# Patient Record
Sex: Male | Born: 1980 | Race: White | Hispanic: No | Marital: Single | Smoking: Current every day smoker
Health system: Southern US, Community
[De-identification: ages and names within clinical notes are randomized; demographics above are authoritative.]

## PROBLEM LIST (undated history)

## (undated) ENCOUNTER — Inpatient Hospital Stay (HOSPITAL_COMMUNITY): Payer: Self-pay | Admitting: Orthopaedic Surgery of the Spine

## (undated) DIAGNOSIS — F32A Depression, unspecified: Secondary | ICD-10-CM

## (undated) DIAGNOSIS — E079 Disorder of thyroid, unspecified: Secondary | ICD-10-CM

## (undated) DIAGNOSIS — F419 Anxiety disorder, unspecified: Secondary | ICD-10-CM

## (undated) HISTORY — DX: Anxiety disorder, unspecified: F41.9

## (undated) HISTORY — DX: Disorder of thyroid, unspecified: E07.9

## (undated) HISTORY — PX: SURGICAL HX OTHER: 99

## (undated) HISTORY — DX: Depression, unspecified: F32.A

## (undated) SURGERY — FACETECTOMY, SPINE, LUMBAR, BY ORTHOPEDICS
Anesthesia: General | Site: Spine Lumbar | Laterality: Bilateral

---
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
## 2006-11-18 ENCOUNTER — Emergency Department (HOSPITAL_COMMUNITY): Admission: EM | Admit: 2006-11-18 | Discharge: 2006-11-18 | Payer: Self-pay | Admitting: Emergency Medicine

## 2007-04-26 ENCOUNTER — Inpatient Hospital Stay (HOSPITAL_COMMUNITY): Admission: EM | Admit: 2007-04-26 | Discharge: 2007-05-02 | Payer: Self-pay | Admitting: Emergency Medicine

## 2007-05-02 ENCOUNTER — Emergency Department (HOSPITAL_COMMUNITY): Admission: EM | Admit: 2007-05-02 | Discharge: 2007-05-02 | Payer: Self-pay | Admitting: Emergency Medicine

## 2007-06-09 ENCOUNTER — Encounter: Admission: RE | Admit: 2007-06-09 | Discharge: 2007-09-07 | Payer: Self-pay | Admitting: Orthopedic Surgery

## 2008-05-07 ENCOUNTER — Emergency Department (HOSPITAL_COMMUNITY): Admission: EM | Admit: 2008-05-07 | Discharge: 2008-05-07 | Payer: Self-pay | Admitting: Emergency Medicine

## 2008-05-16 IMAGING — US US SCROTUM
1 series · 14 of 25 positions shown · non-contrast
Comparison: none

CLINICAL DATA: Swelling and pain.  
SCROTAL ULTRASOUND:
DOPPLER ULTRASOUND OF THE TESTICLES:
TECHNIQUE: Complete ultrasound examination of the testicles, epididymis, and other scrotal structures was performed.  Color and duplex Doppler ultrasound was utilized to evaluate blood flow to the testicles.
No priors for comparison.

[Series 1: unknown · 0.09mm/px · 14 of 38 slices shown]
[im 1/38]
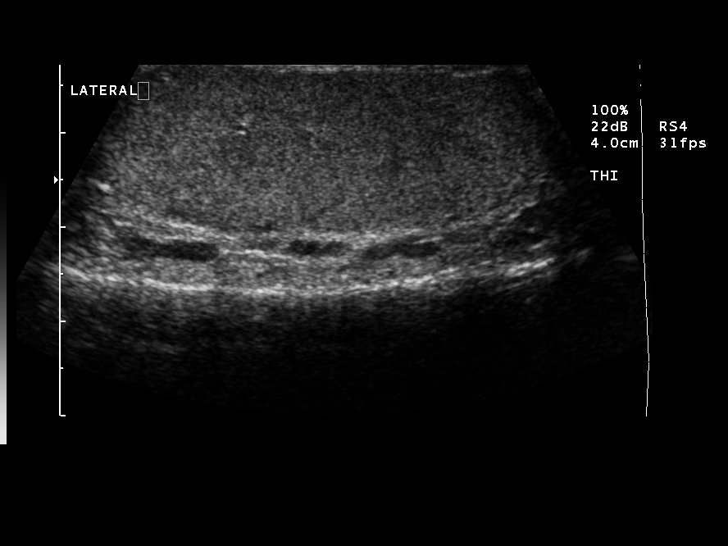
[im 4/38]
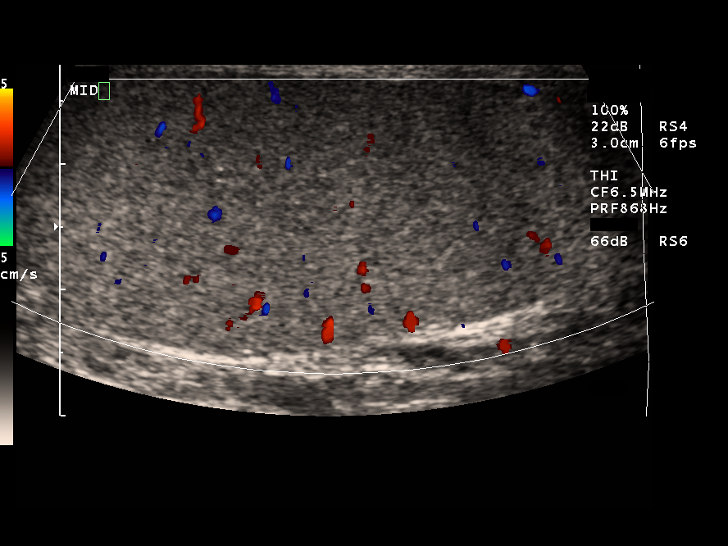
[im 7/38]
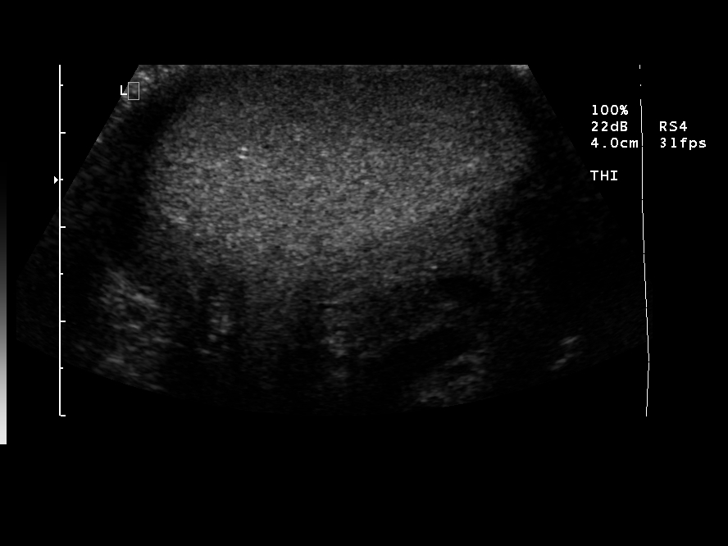
[im 10/38]
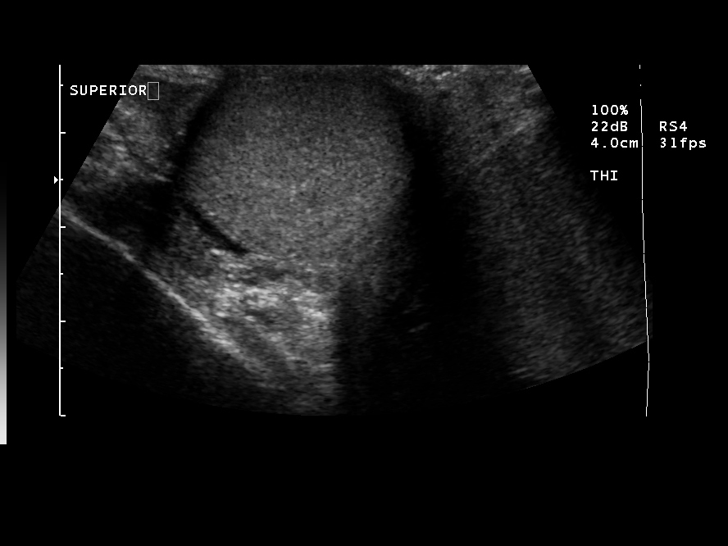
[im 13/38]
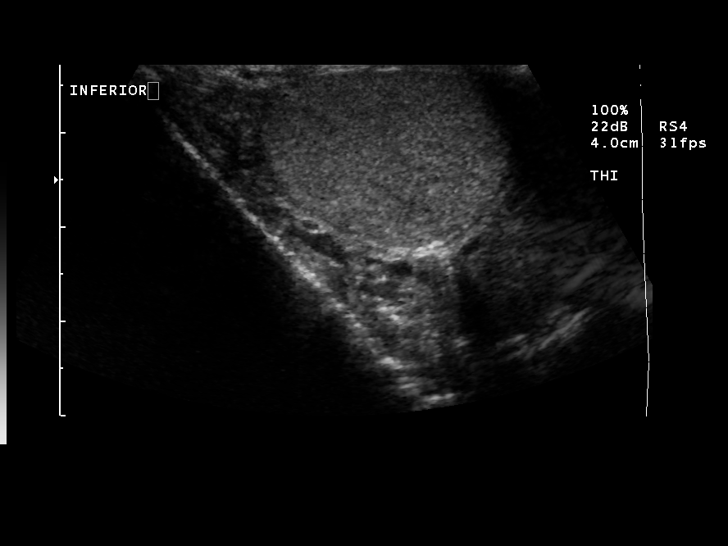
[im 14/38]
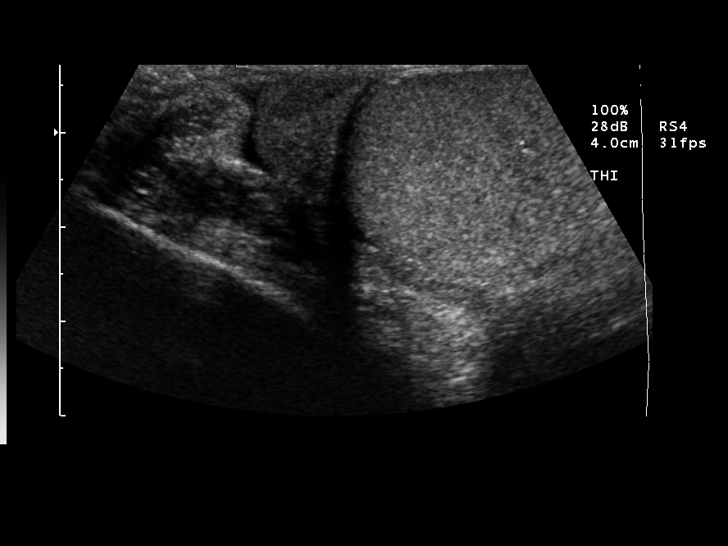
[im 17/38]
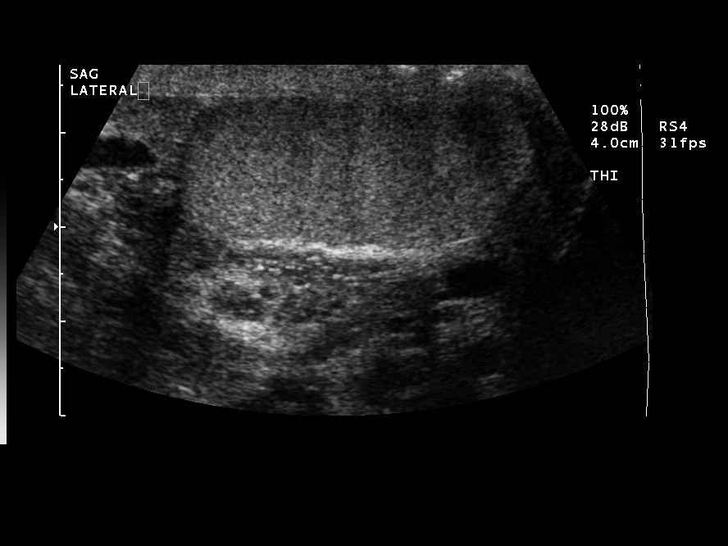
[im 21/38]
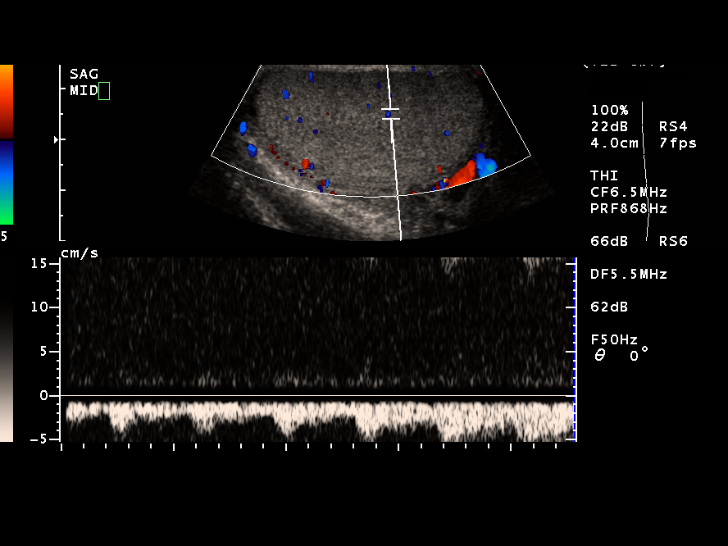
[im 24/38]
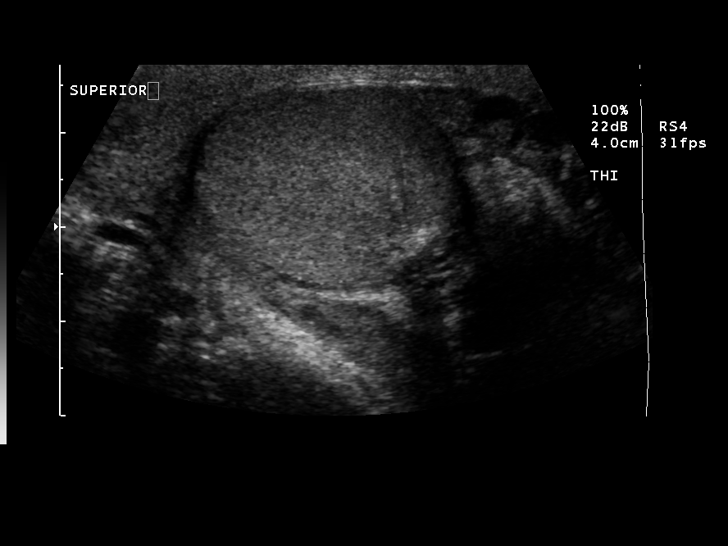
[im 25/38]
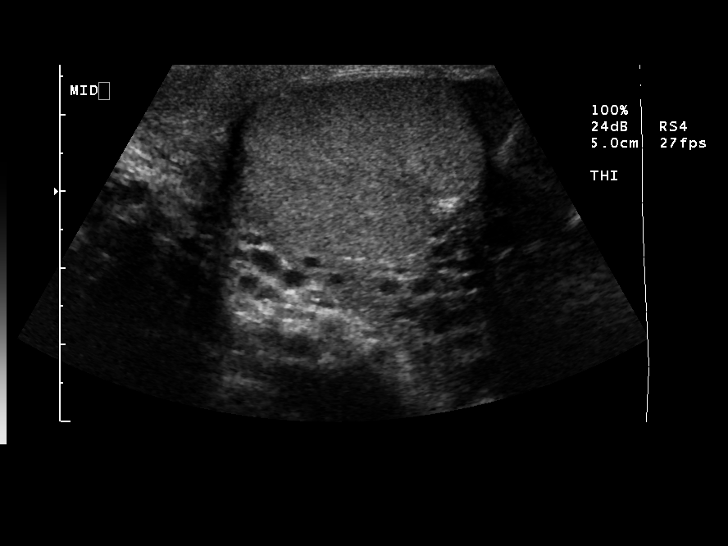
[im 28/38]
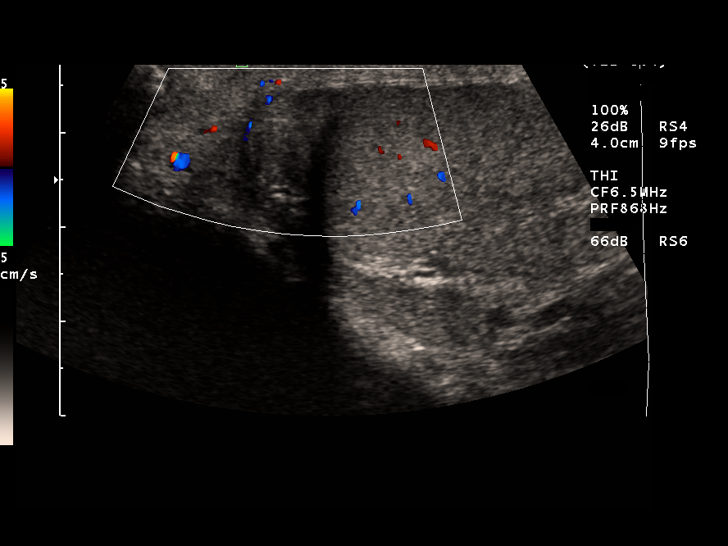
[im 31/38]
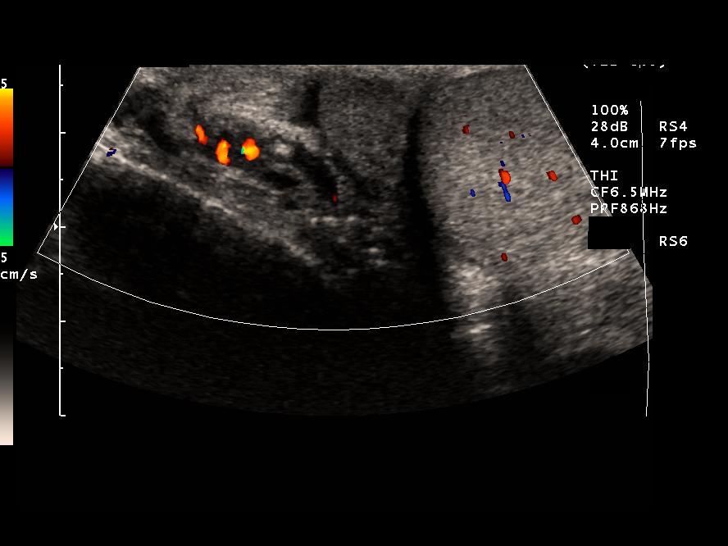
[im 34/38]
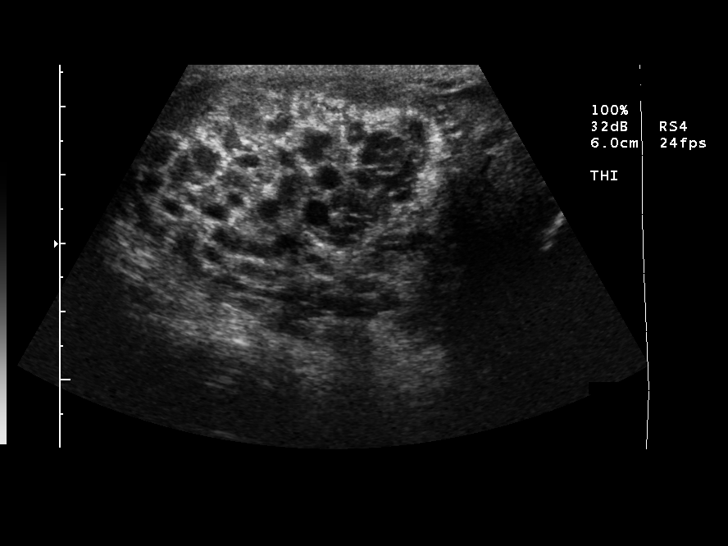
[im 38/38]
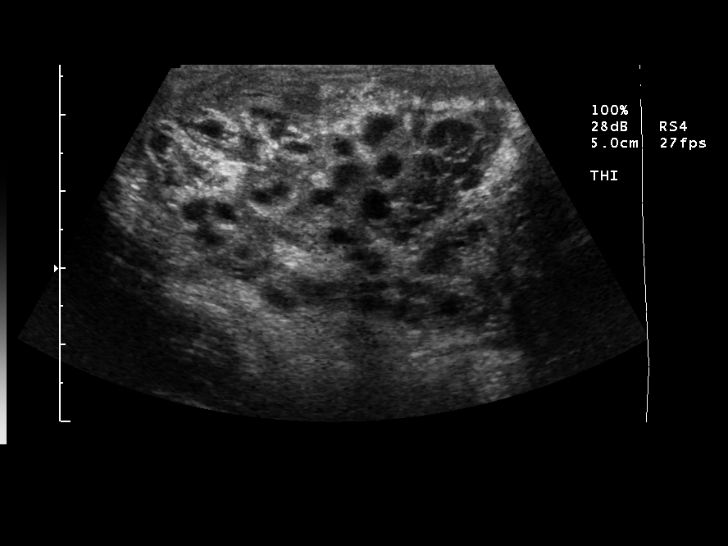

[14 of 25 positions shown; findings below may reference images not displayed]

FINDINGS: Both testes are normal in size and echotexture measuring 5.3 x 2.2 x 2.9 cm on the right and 4.8 x 2.3 x 3.2 cm on the left.  rterial and venous waveforms are seen within both testes.  The epididymides are normal in appearance.  There is a small left hydrocele.  
There is a serpiginous network of tubular lucencies in the region of the spermatic cord on the left, which is shown to have color Doppler flow and increase in size with the Valsalva maneuver, consistent with varicocele.  No evidence of varicocele on the right.
IMPRESSION: 1.  Left-sided varicocele.
2.  Small left hydrocele.
3.  Normal appearance of the testes and epididymides bilaterally.

## 2008-06-24 ENCOUNTER — Emergency Department (HOSPITAL_COMMUNITY): Admission: EM | Admit: 2008-06-24 | Discharge: 2008-06-24 | Payer: Self-pay | Admitting: Emergency Medicine

## 2008-07-14 ENCOUNTER — Emergency Department (HOSPITAL_COMMUNITY): Admission: EM | Admit: 2008-07-14 | Discharge: 2008-07-14 | Payer: Self-pay | Admitting: Emergency Medicine

## 2008-07-15 ENCOUNTER — Emergency Department (HOSPITAL_COMMUNITY): Admission: EM | Admit: 2008-07-15 | Discharge: 2008-07-15 | Payer: Self-pay | Admitting: Emergency Medicine

## 2010-02-07 ENCOUNTER — Ambulatory Visit (HOSPITAL_BASED_OUTPATIENT_CLINIC_OR_DEPARTMENT_OTHER): Admission: RE | Admit: 2010-02-07 | Discharge: 2010-02-07 | Payer: Self-pay | Admitting: Orthopedic Surgery

## 2011-01-29 NOTE — Op Note (Signed)
NAME:  Seth Porter, Seth Porter                ACCOUNT NO.:  [id number]   MEDICAL RECORD NO.:  [id number]          PATIENT TYPE:  INP   LOCATION:  5021                         FACILITY:  [facility]   PHYSICIAN:  Burnard Bunting, M.D.    DATE OF BIRTH:  11/02/1980   DATE OF PROCEDURE:  04/26/2007  DATE OF DISCHARGE:                               OPERATIVE REPORT   ADDENDUM:   PREOPERATIVE DIAGNOSIS:  Open fracture with nail bed laceration distal  phalanx second toe left foot.   POSTOPERATIVE DIAGNOSIS:  Open fracture with nail bed laceration distal  phalanx second toe left foot.   PROCEDURE:  1. Irrigation and debridement with nail bed repair of left foot.  2. Irrigation and debridement with toe nail removal.  3. Nail bed laceration repair left second toe.   SURGEON:  Burnard Bunting, M.D.   Following open reduction and internal fixation of Lisfranc fracture, the  toe nail of the second toe was dangling and was removed.  Nail bed  laceration and fracture of the distal toe was visualized.  This was  irrigated and debrided.  The nail bed laceration was then closed using  two 4-0 chromic sutures.  Bactroban and Xeroform was then placed over  the distal phalanx.      Burnard Bunting, M.D.  Electronically Signed     GSD/MEDQ  D:  04/28/2007  T:  04/28/2007  Job:  [id number]
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
## 2011-06-28 LAB — URINALYSIS, ROUTINE W REFLEX MICROSCOPIC
Bilirubin Urine: NEGATIVE
Hgb urine dipstick: NEGATIVE
Ketones, ur: NEGATIVE
Protein, ur: NEGATIVE
Urobilinogen, UA: 1

## 2011-07-01 LAB — CK TOTAL AND CKMB (NOT AT ARMC)
CK, MB: 16.9 — ABNORMAL HIGH
CK, MB: 20.3 — ABNORMAL HIGH
CK, MB: 25.6 — ABNORMAL HIGH
Relative Index: 1.2
Relative Index: 1.3
Relative Index: 1.9
Total CK: 1057 — ABNORMAL HIGH

## 2011-07-01 LAB — CBC
HCT: 41.7
Hemoglobin: 11.3 — ABNORMAL LOW
Hemoglobin: 13.4
Hemoglobin: 14.2
MCHC: 34.2
MCV: 94.6
Platelets: 343
RBC: 3.49 — ABNORMAL LOW
RBC: 4.4
RDW: 13.4
RDW: 13.8

## 2011-07-01 LAB — BASIC METABOLIC PANEL
Calcium: 8.3 — ABNORMAL LOW
Calcium: 8.4
GFR calc Af Amer: >60
GFR calc non Af Amer: >60
GFR calc non Af Amer: >60
Glucose, Bld: 65 — ABNORMAL LOW
Sodium: 138
Sodium: 138

## 2011-07-01 LAB — DIFFERENTIAL
Basophils Absolute: 0.2 — ABNORMAL HIGH
Basophils Relative: 1
Eosinophils Absolute: 0
Eosinophils Relative: 0

## 2011-07-01 LAB — POCT I-STAT CREATININE: Operator id: 192351

## 2011-07-01 LAB — I-STAT 8, (EC8 V) (CONVERTED LAB)
Acid-base deficit: 2
Chloride: 106
Glucose, Bld: 67 — ABNORMAL LOW
Potassium: 3.6
pH, Ven: 7.435 — ABNORMAL HIGH

## 2011-07-01 LAB — TROPONIN I: Troponin I: 0.03

## 2011-07-01 LAB — PROTIME-INR: INR: 1

## 2014-05-18 ENCOUNTER — Emergency Department (HOSPITAL_COMMUNITY): Payer: Self-pay

## 2014-05-18 ENCOUNTER — Encounter (HOSPITAL_COMMUNITY): Payer: Self-pay | Admitting: Emergency Medicine

## 2014-05-18 ENCOUNTER — Emergency Department (HOSPITAL_COMMUNITY)
Admission: EM | Admit: 2014-05-18 | Discharge: 2014-05-18 | Disposition: A | Discharge status: Home / Self Care | Payer: Self-pay | Attending: Emergency Medicine | Admitting: Emergency Medicine

## 2014-05-18 DIAGNOSIS — F17200 Nicotine dependence, unspecified, uncomplicated: Secondary | ICD-10-CM | POA: Insufficient documentation

## 2014-05-18 DIAGNOSIS — Z720 Tobacco use: Secondary | ICD-10-CM

## 2014-05-18 DIAGNOSIS — J3489 Other specified disorders of nose and nasal sinuses: Secondary | ICD-10-CM | POA: Insufficient documentation

## 2014-05-18 DIAGNOSIS — J209 Acute bronchitis, unspecified: Secondary | ICD-10-CM | POA: Insufficient documentation

## 2014-05-18 DIAGNOSIS — Z79899 Other long term (current) drug therapy: Secondary | ICD-10-CM | POA: Insufficient documentation

## 2014-05-18 DIAGNOSIS — J40 Bronchitis, not specified as acute or chronic: Secondary | ICD-10-CM

## 2014-05-18 DIAGNOSIS — L600 Ingrowing nail: Secondary | ICD-10-CM | POA: Insufficient documentation

## 2014-05-18 DIAGNOSIS — R05 Cough: Secondary | ICD-10-CM | POA: Insufficient documentation

## 2014-05-18 DIAGNOSIS — R059 Cough, unspecified: Secondary | ICD-10-CM | POA: Insufficient documentation

## 2014-05-18 MED ORDER — ALBUTEROL SULFATE HFA 108 (90 BASE) MCG/ACT IN AERS: 1.0000 | INHALATION_SPRAY | Freq: Four times a day (QID) | RESPIRATORY_TRACT | Status: AC | PRN

## 2014-05-18 MED ORDER — GUAIFENESIN 100 MG/5ML PO LIQD: 100.0000 mg | ORAL | Status: AC | PRN

## 2014-05-18 MED ORDER — AZITHROMYCIN 250 MG PO TABS: ORAL_TABLET | ORAL | Status: AC

## 2014-05-18 MED ORDER — LIDOCAINE HCL (PF) 2 % IJ SOLN
10.0000 mL | Freq: Once | INTRAMUSCULAR | Status: AC
  Administered 2014-05-18: 10 mL
  Filled 2014-05-18: qty 10

## 2014-05-18 NOTE — ED Provider Notes (Signed)
CSN: [id number]     Arrival date & time 05/18/14  1018 History   First MD Initiated Contact with Patient 05/18/14 1030     Chief Complaint  Patient presents with  . Cough  . URI  . Foot Pain   Patient is a 33 y.o. male presenting with cough, URI, and lower extremity pain.  Cough URI Presenting symptoms: cough   Foot Pain Associated symptoms include coughing.    Patient is a 33 y.o. Male who presents to the ED with cough x 3 weeks and ingrown toe nail for "some time."  Patient states that his cough was initially productive with yellow sputum, but has now become dry and hacking in nature.  Patient also complains of some congestion, wheezing, and rhinorrhea.  Patient thinks that he has had some fevers, but has also not taken his temperature at home.  Patient is a current every day smoker and smokes 1 ppd.  He has tried tylenol and ibuprofen at home with little relief.  Patient also complains of an ingrown toenail for some time.  He states that the toe is tender and painful and has been draining yellow purulent discharge.  Patient states that he has tried soaking his foot and cutting his toenail.  He denies chills, chest pain, abdominal pain, diarrhea, constipation, melena, hematochezia.  All other ROS are negative at this time.    History reviewed. No pertinent past medical history. History reviewed. No pertinent past surgical history. History reviewed. No pertinent family history. History  Substance Use Topics  . Smoking status: Current Every Day Smoker    Types: Cigarettes  . Smokeless tobacco: Not on file  . Alcohol Use: No    Review of Systems  Respiratory: Positive for cough.     See HPI  Allergies  Shellfish allergy  Home Medications   Prior to Admission medications   Medication Sig Start Date End Date Taking? Authorizing Provider  ketoconazole (NIZORAL) 2 % cream Apply 1 application topically daily as needed for irritation.   Yes Historical Provider, MD  albuterol  (PROVENTIL HFA;VENTOLIN HFA) 108 (90 BASE) MCG/ACT inhaler Inhale 1-2 puffs into the lungs every 6 (six) hours as needed for wheezing or shortness of breath. 05/18/14   Josslyn Ciolek A Forcucci, PA-C  azithromycin (ZITHROMAX) 250 MG tablet Take 2 pills on day 1 Take 1 pill for four days 05/18/14   Pamelyn Bancroft A Forcucci, PA-C  guaiFENesin (ROBITUSSIN) 100 MG/5ML liquid Take 5-10 mLs (100-200 mg total) by mouth every 4 (four) hours as needed for cough. 05/18/14   Fern Canova A Forcucci, PA-C   BP 138/76  Pulse 110  Temp(Src) 98.5 F (36.9 C) (Oral)  Resp 18  SpO2 98% Physical Exam  Nursing note and vitals reviewed. Constitutional: He is oriented to person, place, and time. He appears well-developed and well-nourished. No distress.  HENT:  Head: Normocephalic and atraumatic.  Mouth/Throat: Oropharynx is clear and moist. No oropharyngeal exudate.  Eyes: Conjunctivae and EOM are normal. Pupils are equal, round, and reactive to light. No scleral icterus.  Neck: Normal range of motion. Neck supple. No JVD present. No thyromegaly present.  Cardiovascular: Normal rate, regular rhythm, normal heart sounds and intact distal pulses.  Exam reveals no gallop and no friction rub.   No murmur heard. Pulmonary/Chest: Effort normal and breath sounds normal. No respiratory distress. He has no wheezes. He has no rales. He exhibits no tenderness.  Abdominal: Soft. Bowel sounds are normal. He exhibits no distension and no mass. There is  no tenderness. There is no rebound and no guarding.  Musculoskeletal: Normal range of motion.  Lymphadenopathy:    He has no cervical adenopathy.  Neurological: He is alert and oriented to person, place, and time. No cranial nerve deficit. Coordination normal.  Skin: Skin is warm and dry. He is not diaphoretic.  Right great toe with ingrown toenail.  No active drainage.  Tenderness to palpation.  There is no surrounding erythema.    Psychiatric: He has a normal mood and affect. His behavior  is normal. Judgment and thought content normal.    ED Course  Excise ingrown toenail Date/Time: 05/18/2014 1:55 PM Performed by: Terri Piedra A Authorized by: Terri Piedra A Consent: Verbal consent obtained. Risks and benefits: risks, benefits and alternatives were discussed Consent given by: patient Patient understanding: patient states understanding of the procedure being performed Patient consent: the patient's understanding of the procedure matches consent given Procedure consent: procedure consent matches procedure scheduled Relevant documents: relevant documents present and verified Test results: test results available and properly labeled Site marked: the operative site was marked Imaging studies: imaging studies available Patient identity confirmed: verbally with patient Time out: Immediately prior to procedure a "time out" was called to verify the correct patient, procedure, equipment, support staff and site/side marked as required. Preparation: Patient was prepped and draped in the usual sterile fashion. Local anesthesia used: yes Anesthesia: digital block Local anesthetic: lidocaine 2% without epinephrine Anesthetic total: 4 ml Patient sedated: no Patient tolerance: Patient tolerated the procedure well with no immediate complications.   (including critical care time) Labs Review Labs Reviewed - No data to display  Imaging Review Dg Chest 2 View  05/18/2014   CLINICAL DATA:  Cough, upper respiratory infection.  EXAM: CHEST  2 VIEW  COMPARISON:  04/27/2007.  FINDINGS: Trachea is midline. Heart size normal. Lungs are hyperinflated but clear. Left apical pleural thickening. No pleural fluid.  IMPRESSION: No acute findings.   Electronically Signed   By: Leanna Battles M.D.   On: 05/18/2014 12:16     EKG Interpretation None      MDM   Final diagnoses:  Ingrown right greater toenail  Bronchitis  Tobacco abuse   Patient is a 32 y.o. Male who presents to  the ED with right great toe pain and cough.  Physical exam reveals no wheezes or rales.  CXR is clear.  I have removed the portion of the toe nail.  There are no signs of infection of the toe at this time.  Patient is stable for discharge.  Will send the patient home with albuterol, azithromycin given length of cough, and robitussin.  Patient to follow-up with a doctor of his choosing or community health and wellness.  Patient was discussed with Dr. Micheline Maze who agrees with the above plan.  Patient told to return for PE and PNA symptoms.  He states understanding and agreement at this time.      Eben Burow, PA-C 05/18/14 1355

## 2014-05-18 NOTE — Discharge Instructions (Signed)
Acute Bronchitis Bronchitis is inflammation of the airways that extend from the windpipe into the lungs (bronchi). The inflammation often causes mucus to develop. This leads to a cough, which is the most common symptom of bronchitis.  In acute bronchitis, the condition usually develops suddenly and goes away over time, usually in a couple weeks. Smoking, allergies, and asthma can make bronchitis worse. Repeated episodes of bronchitis may cause further lung problems.  CAUSES Acute bronchitis is most often caused by the same virus that causes a cold. The virus can spread from person to person (contagious) through coughing, sneezing, and touching contaminated objects. SIGNS AND SYMPTOMS   Cough.   Fever.   Coughing up mucus.   Body aches.   Chest congestion.   Chills.   Shortness of breath.   Sore throat.  DIAGNOSIS  Acute bronchitis is usually diagnosed through a physical exam. Your health care provider will also ask you questions about your medical history. Tests, such as chest X-rays, are sometimes done to rule out other conditions.  TREATMENT  Acute bronchitis usually goes away in a couple weeks. Oftentimes, no medical treatment is necessary. Medicines are sometimes given for relief of fever or cough. Antibiotic medicines are usually not needed but may be prescribed in certain situations. In some cases, an inhaler may be recommended to help reduce shortness of breath and control the cough. A cool mist vaporizer may also be used to help thin bronchial secretions and make it easier to clear the chest.  HOME CARE INSTRUCTIONS  Get plenty of rest.   Drink enough fluids to keep your urine clear or pale yellow (unless you have a medical condition that requires fluid restriction). Increasing fluids may help thin your respiratory secretions (sputum) and reduce chest congestion, and it will prevent dehydration.   Take medicines only as directed by your health care provider.  If  you were prescribed an antibiotic medicine, finish it all even if you start to feel better.  Avoid smoking and secondhand smoke. Exposure to cigarette smoke or irritating chemicals will make bronchitis worse. If you are a smoker, consider using nicotine gum or skin patches to help control withdrawal symptoms. Quitting smoking will help your lungs heal faster.   Reduce the chances of another bout of acute bronchitis by washing your hands frequently, avoiding people with cold symptoms, and trying not to touch your hands to your mouth, nose, or eyes.   Keep all follow-up visits as directed by your health care provider.  SEEK MEDICAL CARE IF: Your symptoms do not improve after 1 week of treatment.  SEEK IMMEDIATE MEDICAL CARE IF:  You develop an increased fever or chills.   You have chest pain.   You have severe shortness of breath.  You have bloody sputum.   You develop dehydration.  You faint or repeatedly feel like you are going to pass out.  You develop repeated vomiting.  You develop a severe headache. MAKE SURE YOU:   Understand these instructions.  Will watch your condition.  Will get help right away if you are not doing well or get worse. Document Released: 10/10/2004 Document Revised: 01/17/2014 Document Reviewed: 02/23/2013 [facility] Patient Information 2015 [place], [place]. This information is not intended to replace advice given to you by your health care provider. Make sure you discuss any questions you have with your health care provider.  Ingrown Toenail An ingrown toenail occurs when the sharp edge of your toenail grows into the skin. Causes of ingrown toenails include toenails  clipped too far back or poorly fitting shoes. Activities involving sudden stops (basketball, tennis) causing "toe jamming" may lead to an ingrown nail. HOME CARE INSTRUCTIONS   Soak the whole foot in warm soapy water for 20 minutes, 3 times per day.  You may lift the edge of the nail  away from the sore skin by wedging a small piece of cotton under the corner of the nail. Be careful not to dig (traumatize) and cause more injury to the area.  Wear shoes that fit well. While the ingrown nail is causing problems, sandals may be beneficial.  Trim your toenails regularly and carefully. Cut your toenails straight across, not in a curve. This will prevent injury to the skin at the corners of the toenail.  Keep your feet clean and dry.  Crutches may be helpful early in treatment if walking is painful.  Antibiotics, if prescribed, should be taken as directed.  Return for a wound check in 2 days or as directed.  Only take over-the-counter or prescription medicines for pain, discomfort, or fever as directed by your caregiver. SEEK IMMEDIATE MEDICAL CARE IF:   You have a fever.  You have increasing pain, redness, swelling, or heat at the wound site.  Your toe is not better in 7 days. If conservative treatment is not successful, surgical removal of a portion or all of the nail may be necessary. MAKE SURE YOU:   Understand these instructions.  Will watch your condition.  Will get help right away if you are not doing well or get worse. Document Released: 08/30/2000 Document Revised: 11/25/2011 Document Reviewed: 08/24/2008 [facility] Patient Information 2015 [place], [place]. This information is not intended to replace advice given to you by your health care provider. Make sure you discuss any questions you have with your health care provider.

## 2014-05-18 NOTE — ED Notes (Signed)
Pt offered blanket, refused

## 2014-05-18 NOTE — ED Notes (Signed)
Pt reports not feeling well for weeks. Started as URI/cold symptoms now having productive cough with green/yellow sputum and feeling lightheaded. Also reports excessive walking and now has sores/pain to his feet and is worried its infected. Pt ambulatory at triage, airway intact.

## 2014-05-18 NOTE — ED Provider Notes (Signed)
Medical screening examination/treatment/procedure(s) were performed by non-physician practitioner and as supervising physician I was immediately available for consultation/collaboration.  Denetra Formoso, MD 05/18/14 1716 

## 2014-05-18 NOTE — ED Notes (Signed)
Pt given [place] sandwich and water, waiting for dispo papers.

## 2014-05-27 NOTE — Discharge Planning (Signed)
[facility] [employer] with patient regarding primary care resources and establishing care with a provider. Patient sts he is being followed with care by the [facility]. Resource guide and my contact information provided for any future questions or concerns.

## 2019-10-13 ENCOUNTER — Other Ambulatory Visit: Payer: Self-pay

## 2019-11-05 ENCOUNTER — Telehealth (HOSPITAL_BASED_OUTPATIENT_CLINIC_OR_DEPARTMENT_OTHER): Payer: Self-pay | Admitting: Orthopaedic Surgery of the Spine

## 2019-11-05 NOTE — Telephone Encounter (Signed)
Called patient to advise we do take L&I we just need the auth/referral from them to proceed. Confirmed [phone] to fax that to. Collected referral information so we can proceed once Cody Hart is received. Patient had PT at [facility] in [place]. Requested records today.

## 2019-11-05 NOTE — Telephone Encounter (Signed)
RETURN CALL: Voicemail - Detailed Message      SUBJECT:  Appointment Request     REASON FOR VISIT: [facility] for L&I Claim  PREFERRED DATE/TIME: next availab  ADDITIONAL INFORMATION: none

## 2019-11-18 ENCOUNTER — Ambulatory Visit (HOSPITAL_BASED_OUTPATIENT_CLINIC_OR_DEPARTMENT_OTHER)
Payer: No Typology Code available for payment source | Attending: Orthopaedic Surgery of the Spine | Admitting: Student in an Organized Health Care Education/Training Program

## 2019-11-18 ENCOUNTER — Encounter (HOSPITAL_BASED_OUTPATIENT_CLINIC_OR_DEPARTMENT_OTHER): Payer: Self-pay | Admitting: Orthopaedic Surgery of the Spine

## 2019-11-18 VITALS — BP 124/88 | HR 78 | Temp 99.4°F | Resp 16 | Ht 75.0 in | Wt 180.0 lb

## 2019-11-18 DIAGNOSIS — M5020 Other cervical disc displacement, unspecified cervical region: Secondary | ICD-10-CM | POA: Insufficient documentation

## 2019-11-18 NOTE — Patient Instructions (Signed)
It was a pleasure to see  you today.     We discussed surgery on your spine to include C6-7 discectomy and fusion vs C6 corpectomy and C5-7 anterior fusion.     About 1-2 weeks before surgery, the spine team will see you again for a "PRE OP VISIT". This will also include a visit with our clinic nurses, "RN TEACHING" as well as a visit with the anesthesia team nurses "PRE ANESTHESIA VISIT".  If you are having a very large surgery or if you have certain medical problems, you will also need to see our [place] team who work with [place] to make sure you are safe to proceed with surgery or help to optimize your medical issues for upcoming surgery.     All of these visits will be arranged by our PATIENT CARE COORDINATORS. You may have met them today in clinic or they may be planning to give you a call later this week.     Eulogio Ditch works with Dr. Talbert Cage and Dr Tawnya Crook and can be reached at [place] works with Dr. Quintella Reichert and can be reached at [phone].     Please call them directly if you have any questions about your surgery. If they don't know they answer they will make sure your questions get answered.     Also before surgery, we may have ordered new imaging studies or other tests. Please get these done as soon as possible so this does not delay your surgery.     CT scan    Please let us know if you have any other questions before your surgery.     Clementeen Hoof, Coordinator  [facility]  [phone] Fusion -- taken from: [email or web address]    Spinal fusion is a surgical procedure used to correct problems with the small bones in the spine (vertebrae). It is essentially a "welding" process. The basic idea is to fuse together two or more vertebrae so that they heal into a single, solid bone. This is done to eliminate painful motion or to restore stability to the spine.  Spine surgery is usually recommended only when your doctor can  pinpoint the source of your pain. To do this, your doctor may use imaging tests, such as x-rays, computerized tomography (CT) scans, and magnetic resonance imaging (MRI) scans.  Spinal fusion may help relieve symptoms of many back problems, including:  Degenerative disk disease   Spondylolisthesis   Spinal stenosis   Scoliosis   Fractured vertebra   Infection   Herniated disk   Tumor    Understanding how your spine works will help you better understand spinal fusion. Learn more about your spine: Spine Basics.  Spinal fusion can be performed in any region of the spine.      Description  Spinal fusion eliminates motion between vertebrae. It also prevents the stretching of nerves and surrounding ligaments and muscles. It is an option when motion is the source of pain, such as movement that occurs in a part of the spine that is arthritic or unstable due to injury, disease, or the normal aging process. The theory is if the painful vertebrae do not move, they should not hurt.  If you have leg pain or arm pain in addition to back pain, your surgeon may also perform a decompression (laminectomy). This procedure involves removing bone and diseased tissues that are putting pressure on spinal nerves.  Fusion will take away some spinal flexibility, but most spinal  fusions involve only small segments of the spine and do not limit motion very much. The majority of patients will not notice a decrease in range of motion. Your surgeon will talk with you about whether your specific procedure may impact flexibility or range of motion in your spine.  To help you understand the main terms and abbreviations regarding spinal fusion, a glossary has been developed: Spinal Fusion Glossary    Procedure  Lumbar and cervical spinal fusion have been performed for decades. There are several different techniques that may be used to fuse the spine. There are also different "approaches" your surgeon can take to reach your spine.  Your surgeon may  approach your spine from the front. This is called an anterior approach and requires an incision in the lower abdomen for a lumbar fusion or in the front of the neck for a cervical fusion. (Related Article: Anterior Lumbar Interbody Fusion)  A posterior approach is done from the back. (Related Articles: Posterolateral Lumbar Fusion and Posterior Lumbar Interbody Fusion and Transforaminal Lumbar Interbody Fusion)  In a posterior approach to lumbar surgery, an incision is made down the middle of the lower back, over the vertebrae to be fused.  In a lateral approach, your surgeon approaches your spine from the side. (Related Article: Lateral Lumbar Interbody Fusion)  Minimally invasive techniques have also been developed. These allow fusions to be performed with smaller incisions.  The right procedure for you will depend on the nature and location of your disease.    Bone Grafting  All spinal fusions use some type of bone material, called a bone graft, to help promote the fusion. Generally, small pieces of bone are placed into the space between the vertebrae to be fused.  A bone graft is primarily used to stimulate bone healing. It increases bone production and helps the vertebrae heal together into a solid bone. Sometimes larger, solid pieces are used to provide immediate structural support to the vertebrae.  In the past, a bone graft harvested from the patient's pelvis was the only option for increasing the material needed for fusing the vertebrae. This type of graft is called an autograft. Harvesting a bone graft requires an additional incision during the operation. It lengthens surgery and can cause increased pain after the operation.  Most autografts are harvested from an area of the pelvis called the "iliac crest."  If you are having a decompression procedure, the surgeon may harvest your bone from the site of the decompression and use it as the graft. This type of graft is called a local autograft. The bone is  essentially recycled; it is moved from where it is compressing your nerves to the area the surgeon wants to fuse.   One alternative to harvesting a bone graft is an allograft, which is cadaver bone. An allograft is typically acquired through a bone bank.    Today, several artificial bone graft materials have also been developed:  Demineralized bone matrices (DBMs). Calcium is removed from cadaver bone to create DBMs. Without the mineral, the bone can be changed into a putty or gel-like consistency. DBMs are usually combined with other grafts, and may contain proteins that help in bone healing.   Bone morphogenetic proteins (BMPs). These very powerful synthetic bone-forming proteins promote a solid fusion. They are approved by the U[place]. Food and Drug Administration for use in the spine in certain situations. Autografts may not be needed when BMPs are used.   Synthetic bone. Synthetic bone grafts are made  from calcium/phosphate materials and are often called "ceramics." They are similar in shape and consistency to autograft bone.  Your surgeon will discuss with you the type of bone graft material that will work best for your condition and procedure.      Immobilization  After bone grafting, the vertebrae need to be held together to help the fusion progress. Your surgeon may suggest that you wear a brace.  In many cases, surgeons will use plates, screws, and rods to help hold the spine still. This is called internal fixation, and may increase the rate of successful healing. With the added stability from internal fixation, most patients are able to move earlier after surgery.      Complications  As with any surgery, there are risks associated with spinal fusion. Your doctor will discuss each of the risks with you before your procedure and will take specific measures to help avoid potential complications. Potential risks and complication of spinal fusion include:  Infection. Antibiotics are regularly given to the patient  before, during, and often after surgery to lessen the risk of infections.   Bleeding. A certain amount of bleeding is expected, but this is not typically significant. It is not usually necessary to donate blood before spinal fusion.   Pain at graft site. A small percentage of patients will experience persistent pain at the bone graft site.   Recurring symptoms. Some patients may experience a recurrence of their original symptoms. There are various causes for this. If your original symptoms recur, inform your doctor so that he or she can determine what is causing your symptoms.   Pseudarthrosis. This is a condition in which there is not enough bone formation. Patients who smoke are more likely to develop a pseudarthrosis. Other causes include diabetes and older age. Moving too soon-before the bone is able to start fusing-may also result in a pseudarthrosis. If this occurs, a second surgery may be needed in order to obtain a solid fusion.   Nerve damage. It is possible that nerves or blood vessels may be injured during these operations. These complications are very rare.   Blood clots. Another uncommon complication is the formation of blood clots in the legs. These pose significant danger if they break off and travel to the lungs.    Warning Signs  It is important that you carefully follow any instructions from your doctor relating to the warning signs of blood clots and infection. These complications are most likely to occur during the first few weeks after surgery.  --Blood clots. Warning signs of a possible blood clot include:  Swelling in the calf, ankle or foot   Tenderness or redness, which may extend above or below the knee   Pain in the calf  Occasionally, a blood clot will travel through the bloodstream and may settle in the lungs. If this happens, you may experience sudden chest pain and shortness of breath or coughing. If you experience any of these symptoms, you should notify your doctor immediately. If  you cannot reach your doctor, have someone take you to the hospital emergency room or call 911.    --Infection. Infection following spine surgery occurs very rarely. Warning signs of infection include:  Redness, tenderness, and swelling around the wound edges   Drainage from the wound   Pain or tenderness   Shaking chills   Elevated temperature, usually above 101F if taken with an oral thermometer  If any of these symptoms occur, you should contact your doctor or go  to the nearest emergency room immediately.  Recovery  Pain Management  After surgery, you will feel some pain. This is a natural part of the healing process. Your doctor and nurses will work to reduce your pain, which can help you recover from surgery faster.  Medications are often prescribed for short-term pain relief after surgery. Many types of medicines are available to help manage pain, including opioids, nonsteroidal anti-inflammatory drugs (NSAIDs), and local anesthetics. Your doctor may use a combination of these medications to improve pain relief, as well as minimize the need for opioids.  Be aware that although opioids help relieve pain after surgery, they are a narcotic and can be addictive. Opioid dependency and overdose has become a critical public health issue in the [place] It is important to use opioids only as directed by your doctor. As soon as your pain begins to improve, stop taking opioids. Talk to your doctor if your pain has not begun to improve within a few days of your surgery.  Rehabilitation  The fusion process takes time. It may be several months before the bone is solid, although your comfort level will often improve much faster. During this healing time, the fused spine must be kept in proper alignment. You will be taught how to move properly, reposition, sit, stand, and walk.  Your symptoms will gradually improve, as will your activity level. Right after your operation, your doctor may recommend only light activity, like  walking. As you regain strength, you will be able to slowly increase your activity level. Physical therapy is typically started from 6 weeks to 3 months after surgery. Your surgeon will talk with you about whether physical therapy is needed in your situation.  Maintaining a healthy lifestyle and following your doctor's instructions will greatly increase your chances for a successful outcome.    If you have additional questions after your visit, please contact the clinic.     Sincerely,   Forde Radon, [facility]  [place]  [phone]    POST-OPERATIVE SPINE SURGERY INSTRUCTIONS      CONTACT INFORMATION:   * Appointment Scheduling: [phone]  * Questions During Clinic Hours : [phone] PRESS 2  * Questions after hours: [phone]    Patient Care Coordinators:  Eulogio Ditch (Drs Gary Fleet) -- [phone]  Graylin Shiver (Dr Quintella Reichert) --[phone]  Stark Klein (Dr Leanord Asal) -- [phone]    IMPORTANT PATIENT SPECIFIC MODIFICATIONS:   * Weight bearing status: weight bearing as tolerated with walker or cane    PAIN MANAGEMENT:   **please note that laws are changing and more and more insurance companies are only covering a limited supply of narcotics. This may mean you have to pay OUT OF POCKET for pain medications.**     PAIN CONTROL      You should expect to wean off the oxycodone usually within the first week after surgery.   * Some discomfort is normal and expected following any operation.   * You will be given a prescription for pain medications along with instructions for use. Take them as directed on the label and with food. Use caution not to lose them, drop them in the toilet, etc.   * Please let us know a few days in advance before you run out of pain medication. You may call the Orthopedic Pharmacist at [phone]. We can only prescribe pain medications during the first 6 weeks (42 days) after surgery. If you need medications after that time, you will need to see your PCP.   *  DO NOT operate heavy machinery, drive, or drink alcohol while on narcotic medications.   * Pain medications can cause constipation, nausea and vomiting, sometimes itching and hives. Drink plenty of fluids and eat a diet higher in fiber after surgery. For constipation consider over the counter additions i.e. Metamucil, Senokot, docusate or another over the counter stool softener. Take your pain medications with small amounts of food to decrease nausea/vomiting. Medication can be prescribed for nausea as needed, on a case-by-case basis.   * If your pain is not adequately controlled by the prescription medication, contact your surgeon's office or the Orthopedic pharmacy at the numbers listed on this sheet.   * Note: Post-op pain is typically increased and more difficult to manage if you were taking opiate pain medication prior to surgery.       ITEMS TO PICK UP AT PHARMACY PRIOR TO SURGERY   -- senna and/or docusate -- these are two different stool softeners to prevent constipation caused by pain medications.   -- waterproof bandages (such as Tegaderm) to cover incisions while sutures in place for showering. Many brands and sizes exist.   --a walker or cane may be needed after thoracic or lumbar surgery to help you get around safely.  -- it is usually helpful to have some sterile gauze (4x4), tape, sterile bandages on hand in case you need them afterwards.     DRESSING CHANGES/WOUND CARE:   * Follow the instructions from the nurse about keeping your incision dry. Depending on the procedure, you may need to keep it dry for 5-7 days after surgery.   * Your incision sites will be protected with a bandage.   * Keep the incision covered with appropriately sized bandages. Once the drainage has stopped, you may choose to leave the incision uncovered. If the stitches are irritating or pulling on your clothing, it is okay to place a clean dry dressing over it.   * Sutures/stitches are typically removed about 3 weeks after  surgery at your first post-operative clinic visit.   *Do not peel off any skin glue or scabs from your incision.  Allow it to fall off naturally.   * Mild - moderate bleeding may occur at the incision sites. This should decrease quickly over time.   * You should anticipate changing the dressing daily or more frequently if it becomes wet or soiled.  * DO NOT touch incisions, DO NOT apply ointment/cream; DO NOT let dirt/dust get in/on incisions.   * At each dressing change evaluate the incision(s) for infection, report early signs of infection IMMEDIATELY.   * Odorless drainage that is clear with slightly red/yellow tinge is normal as long as it decreases over time. Drainage that is solidly white/yellow/green/brown with an unpleasant odor increasing over time is abnormal.   * Redness around the incision is normal but should slowly decrease over time. Redness that is expanding or becoming brighter or fiery hot to touch over time is abnormal.   * Bruising is normal after surgery and may spread over a larger surface area due to gravity.   * A slight fever initially post-op is not uncommon however should be closely watched and spike in temperature should be reported immediately especially if associated with chills, night sweats, increasing pain. Contact us for fevers over 101.   * Avoid baths, swimming pools and hot tubs until all incisions are completely healed. AT LEAST 3-4 weeks post-op.     SHOWERING:   *follow the when to shower  instructions as provided by the nurse. It depends on what kind of surgery you had.  * After sutures are removed, simply let water roll over your incision sites only. No complete submersion or soaking in bathtub, pools, hot tubs, lakes, etc. until incisions are completely healed.   * Use a stool/chair/seat in the shower if you feel unstable or unsafe.     ICE/SWELLING:   * Swelling is normal after surgery.   * Ice is very therapeutic and helps reduce pain and swelling. Ice via ice packs,  freezer wraps, or an ice machine.   * Never place ice packs directly on your skin.   * It is best to ice your surgical site every 2-3 hours for 20-30 minutes for 14 days after surgery. Plan to ice after physical therapy and home exercises.   * You may experience increased swelling after longer periods of being upright or after increased activity.   *when resting keep your legs elevated to help reduce swelling in the legs    DEEP VEIN THROMBOSIS (DVT):   * When a blood clot has formed in a deep vein of the leg. This may result in redness, swelling, warmth and/or pain or cramps of the lower leg or calf region. You may also experience shortness of breath, difficulty breathing, chest pain or tightness. CONTACT THE CLINIC IMMEDIATELY IF YOU ARE CONCERNED ABOUT THESE SYMPTOMS.  * For several days (at least until you are walking about most of the day), rotate your ankle, bend your toes and move your foot back and forth and from side to side, frequently (at least a few minutes every hour while you are awake) in order to keep blood circulation flowing so that blood clotting will be less likely to occur. The better circulation, the less chance of blood clotting and a DVT.       ACTIVITY/RESTRICTIONS:     Follow the guidelines below to do your daily activities. At first you may need to take a lot of breaks. Be sure to include rest times in your plan for each day. You may only be able to walk around for short periods at a time with longer rests in between. Right after surgery start with 5 minutes of activity every 2-3 hours. As you improve and have a better sense of your ability, strength and stamina, you can increase the length of activity sessions.     Make sure you get as much sleep as possible. Sometimes it is hard to sleep for long periods of time due to pain, so you may find yourself taking naps during the day. This is okay, but realize that sometimes that will cause you not to be able to sleep at night.     Try to get up  and get dressed every day. It will help you feel better and more normal.     Slowly resume the hobbies and other life activities you enjoy as long as you are not restricted from those activities.     PROTECT YOUR SPINE    Until your healthcare team tells you otherwise, remember to follow the BLT's. That's   BENDING: Limit the amount and frequency of bending of your back  LIFTING: do not lift more than 10 pounds. (approximately a gallon of milk) You will be instructed at your post op visits when you are allowed to increase this.   TWISTING: Limit the amount of twisting of your back.     Avoid strenuous pushing pulling and lifting.  Ask a friend or family member to help you with grocery shopping, other household chores, picking up children and pets. Careful if you have a dog to walk that it is not pulling you around or making you fall.       SLEEPING:  Try to use a mattress with good support. You may sleep in any position that is comfortable.   Use a small neck pillow or rolled towel if you are wearing a neck brace.   When lying on your back, place a pillow under your knees to take pressure off your lower back.   When lying on your side, place a pillow between your legs to keep your spine and pelvis better aligned.     GETTING DRESSED:  Try to avoid twisting your upper body when you get dressed and undressed. Ask for help if needed from a family member or close friend. Wear loose-fitting tops so you can easily get them on and off.   You can purchase a long shoe horn and other assistive devices to help put on shoes and socks without bending over.   You may also consider buying a "grabber" to help pick things up off the ground that you drop.             DRIVING    For 2 weeks limit long rides in the car. If you need to be in the care for a long time, get out often and walk around for 5-10 minutes to avoid excessive stiffness.     Do NOT drive if you are wearing a brace or if you are taking any opiate pain medications.  Your reaction time will be slowed and it is considered "driving under the influence"    Try to keep moving and avoid lying in bed/couch all day.     REASONS TO CALL: It is important to call our office [phone]) and speak to our clinic nurse if you have questions or any of the following issues:     * Fever (101 or greater), shaking, chills or sweats/night sweats   * Increased redness, swelling, warmth or pain in/around the incisions, non-clear drainage from the incision   * Calf swelling, redness, pain or warmth, cramps in your lower legs, loss of sensation to foot/toes, blue limb (possible signs of a blood clot)  * Chest pain, chest tightness, shortness of breath, difficulty breathing, heart palpitations   * Inability to have a bowel movement after 3 days and/or inability to urinate after 1 day or other bowel or bladder symptoms  * Uncontrolled nausea/vomiting   * New onset of numbness or tingling.   * A fall or new injury  *Severe headaches    If any of these concerns occur after-hours, please call [phone] to speak with the triage nurse. They may forward you to the on call Orthopedic resident or fellow if needed.   OR go to the nearest Emergency Room.     If you have additional questions after your visit, please contact the clinic.     Sincerely,   Clementeen Hoof, [place]  [phone]
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
## 2019-11-25 ENCOUNTER — Encounter (HOSPITAL_BASED_OUTPATIENT_CLINIC_OR_DEPARTMENT_OTHER): Payer: Self-pay | Admitting: Case Manager/Care Coordinator

## 2019-11-25 ENCOUNTER — Ambulatory Visit (HOSPITAL_BASED_OUTPATIENT_CLINIC_OR_DEPARTMENT_OTHER)
Payer: No Typology Code available for payment source | Attending: Orthopaedic Surgery of the Spine | Admitting: Case Manager/Care Coordinator

## 2019-11-25 DIAGNOSIS — S29012A Strain of muscle and tendon of back wall of thorax, initial encounter: Secondary | ICD-10-CM

## 2019-11-25 DIAGNOSIS — Z026 Encounter for examination for insurance purposes: Secondary | ICD-10-CM

## 2019-11-25 DIAGNOSIS — S161XXA Strain of muscle, fascia and tendon at neck level, initial encounter: Secondary | ICD-10-CM
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
## 2019-12-07 ENCOUNTER — Other Ambulatory Visit (HOSPITAL_BASED_OUTPATIENT_CLINIC_OR_DEPARTMENT_OTHER)
Admit: 2019-12-07 | Discharge: 2019-12-07 | Disposition: A | Discharge status: Home / Self Care | Payer: Commercial Managed Care - HMO | Attending: Physician Assistant | Admitting: Physician Assistant

## 2019-12-07 ENCOUNTER — Other Ambulatory Visit (HOSPITAL_BASED_OUTPATIENT_CLINIC_OR_DEPARTMENT_OTHER): Payer: Self-pay | Admitting: Physician Assistant

## 2019-12-07 DIAGNOSIS — M5412 Radiculopathy, cervical region: Secondary | ICD-10-CM

## 2019-12-07 DIAGNOSIS — Z20822 Contact with and (suspected) exposure to covid-19: Secondary | ICD-10-CM

## 2019-12-07 DIAGNOSIS — Z01812 Encounter for preprocedural laboratory examination: Secondary | ICD-10-CM

## 2019-12-07 DIAGNOSIS — Z01818 Encounter for other preprocedural examination: Secondary | ICD-10-CM | POA: Insufficient documentation

## 2019-12-08 ENCOUNTER — Other Ambulatory Visit: Payer: Self-pay

## 2019-12-09 ENCOUNTER — Ambulatory Visit (HOSPITAL_BASED_OUTPATIENT_CLINIC_OR_DEPARTMENT_OTHER): Payer: No Typology Code available for payment source

## 2019-12-09 ENCOUNTER — Other Ambulatory Visit (HOSPITAL_BASED_OUTPATIENT_CLINIC_OR_DEPARTMENT_OTHER): Payer: Self-pay | Admitting: Physician Assistant

## 2019-12-09 ENCOUNTER — Ambulatory Visit (HOSPITAL_BASED_OUTPATIENT_CLINIC_OR_DEPARTMENT_OTHER): Payer: No Typology Code available for payment source | Admitting: Physician Assistant

## 2019-12-09 ENCOUNTER — Encounter (HOSPITAL_BASED_OUTPATIENT_CLINIC_OR_DEPARTMENT_OTHER): Payer: Self-pay

## 2019-12-09 ENCOUNTER — Ambulatory Visit (HOSPITAL_BASED_OUTPATIENT_CLINIC_OR_DEPARTMENT_OTHER): Payer: No Typology Code available for payment source | Admitting: Case Manager/Care Coordinator

## 2019-12-09 ENCOUNTER — Ambulatory Visit (HOSPITAL_BASED_OUTPATIENT_CLINIC_OR_DEPARTMENT_OTHER): Payer: No Typology Code available for payment source | Attending: Orthopaedic Surgery of the Spine

## 2019-12-09 DIAGNOSIS — M5020 Other cervical disc displacement, unspecified cervical region: Secondary | ICD-10-CM

## 2019-12-09 DIAGNOSIS — Z01818 Encounter for other preprocedural examination: Secondary | ICD-10-CM

## 2019-12-09 DIAGNOSIS — Z026 Encounter for examination for insurance purposes: Secondary | ICD-10-CM

## 2019-12-09 DIAGNOSIS — M5412 Radiculopathy, cervical region: Secondary | ICD-10-CM

## 2019-12-09 DIAGNOSIS — S161XXA Strain of muscle, fascia and tendon at neck level, initial encounter: Secondary | ICD-10-CM

## 2019-12-09 DIAGNOSIS — S29012A Strain of muscle and tendon of back wall of thorax, initial encounter: Secondary | ICD-10-CM

## 2019-12-09 LAB — PROTHROMBIN & PTT
Partial Thromboplastin Time: 27 s (ref 22–35)
Prothrombin INR: 1 (ref 0.8–1.3)
Prothrombin Time Patient: 12.4 s (ref 10.7–15.6)

## 2019-12-09 LAB — TOBACCO AND NICOTINE, URINE (QUALITATIVE)
Anabasine, URN: NEGATIVE
Cotinine (Qual), URN: NEGATIVE
Nicotine, URN: NEGATIVE

## 2019-12-09 NOTE — Progress Notes (Signed)
EDUCATION ASSESSMENT:    Primary Learner: Patient    Challenges impacting this teaching session: None    Teaching:    Method(s) used for teaching: Discussion, Patient teaching packet, Care Map      DISCHARGE READINESS:  PCP verified: Yes   Pain Service Specialist: No     Care Partner at home:   Name: Victorino Dike  Relationship: spouse  Contact information: [phone]  Length of days planning to stay with patient: 7    Transportation specific plan at discharge:  Personal Vehicle    Home environment accessible without accommodations: Yes     Patient specific plans to discharge to: Home          LOS (subject to change and meeting discharge criteria reviewed): 1 days     Patient discharge ready:  Yes    Referral: N/A    PRE OPERATIVE INSTRUCTIONS:   Preparing the home for post-op care (family, pets)   Day of surgery instructions/what will happen   Patient Information Handbook provided to patient   Procedure specific instructions provided to patient   NPO guidelines provided to patient   Skin Prep instructions and CHG provided to patient   Estimated LOS and discharge criteria reviewed    POST OPERATIVE INSTRUCTIONS:   Contact Information   Pain Management   Assistive Devices   Activity restrictions   Discussed signs & symptoms of wound infection   Follow-up Appointments: Scheduled with [facility]      POST EDUCATION RESPONSE:    Post educational response: States understanding    Follow-up: Continue teaching topic

## 2019-12-09 NOTE — Progress Notes (Signed)
I saw and evaluated the patient. I have reviewed the resident's documentation and agree.
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
## 2019-12-09 NOTE — Preprocedure Instructions (Signed)
For any unexpected problems (such as cold, flu, or fever) or if you are unable to keep your appointment call one of the following:   The operating room-the evening prior to surgery after 8 pm, [phone]  Call ambulatory surgery unit [phone] on the day of surgery.     Check in on the ground floor of the [facility] building on the day of surgery.    You will receive a call the day before surgery with the arrival time    Pre procedure instructions:   Drink plenty of water (6-8 glasses) the day BEFORE surgery.     Do NOT eat 6 hours prior to arrival.   Up until 2 hours prior to arrival you may have CLEAR liquids only (water, apple juice, Gatorade or Ensure clear) NO MILK OR MILK PRODUCTS.     The night before surgery: drink one carton of Ensure clear and take 1000 mg of Tylenol.   Drink one carton of Ensure clear 2 hours prior to arrival time  on the day of surgery.     Take: Methimazioleon the day of surgery with a small sip of water.     HOLD NSAIDS (ibuprofen, motrin, naproxen, aleve, ketorolac or diclofenac) 7 days prior to surgery.  Tylenol (acetaminophen is ok to take)  HOLD fish oil, flax seed oil and all herbal supplements 7 days prior to surgery.

## 2019-12-09 NOTE — Patient Instructions (Signed)
Healthy Eating Before Surgery      8 HOURS BEFORE    you arrive at the hospital, you may eat a healthy, balanced diet. 6 HOURS BEFORE    you arrive at the hospital, You may eat a healthy snack. Avoid heavy foods, such as those with a large amount of fat.    IMPORTANT: AFTER YOUR SNACK DO NOT EAT ANYTHING ELSE. UP UNTIL 2 HOURS BEFORE    you arrive at the hospital, DO NOT EAT ANYTHING.  We encourage you to drink clear liquids such as water, plain tea or coffee (no milk or creamer), clear broth, Gatorade, soda, apple juice, or Boost Breeze liquid supplement.    If you have diabetes, avoid liquids with sugar such as juice, regular soda, and sports drinks, since these can raise your blood sugar. Drink water, plain tea or coffee (no milk or creamer), clear broth and diet soda. 2 HOURS BEFORE    you arrive at the hospital, DO NOT DRINK ANYTHING, unless your doctor or nurse has told you otherwise.     Your surgery may be postponed or cancelled if you do not follow these instructions.    Questions? 6am-6pm call [phone]. 6pm-6am call [phone]    Skin Prep Before Surgery    What is CHG soap?  Before your surgery, you will use a special soap to clean your body. The soap contains an antiseptic called chlorhexidine gluconate (CHG). Cleaning your body with CHG soap before surgery helps prevent infection.     If you have pain or itching after using the soap:    • Stop using the soap.   • Rinse the area well.  • Tell staff about your pain or itching on the morning of your surgery.     If you are allergic to chlorhexidine, use regular soap to shower with.    Night Before Surgery  o Shower as usual with soap and water. Use regular shampoo on your hair. Then wash off the soap completely.   o Do not shave.   o Turn off the water and apply the CHG soap to your body from the neck down using a wash cloth. Do not use CHG soap on your face, hair, genitals, or areas where you have open sores.  o If possible, let the soap stay  on your skin for at least 1 minute.  o Rinse well.   o Use a clean towel to dry off.  o Do not apply any other soaps, lotions, moisturizers, or makeup after you have used the CHG soap.   o Put on clean nightwear and use clean sheets on your bed after you shower.    Morning of Surgery   o Repeat the procedure from the night before.   o Put on clean clothes after you shower.    Pain Control After Spine Surgery  What to expect    This handout describes the different types of pain you will have after spine surgery. It explains how we will help you manage your pain.    What kind of pain will I have after surgery?  Surgery causes acute (short-term) pain. Acute pain is part of healing.  After surgery, you will feel acute pain from:    · The incision  · Tissue damage  · Inflammation, the body's natural response to inury    How is pain managed after surgery?  Pain control is a big part of your recovery. We will work with you to   manage you pain. We will do all we can to help make you comfortable.    You will not be pain-free for some time.  Our goal is to control your pain so you can do the activities that will help you recover. You will have discomfort as you recover and become more active.    We will ask you about your pain level many times. Please tell us how you are feeling. Your feedback is vital.     Opioid Pain Medicines  Most patients take prescription pain medicine (opioids) after surgery. Opioids help control acute pain. They are not used to treat chronic (long-term) pain. We will prescribe opioids for no more than 6 weeks after surgery.    Multi-modal Approach  We will use a multi-modal approach (various methods) to control your pain. This helps lower risk of some of the problems opioids can cause.      In the Hospital  At first, most patients will take both opioids and other pain medicines. Our goal is to start slowly decreasing your opioid dose even before you go home.    At Home  Keep tapering  (decreasing) your  opioid dose. Take it as prescribed at first. Then start to take a lower dose or wait longer between doses, or both.    When you take a dose of opioids, be patient. It can take 20 to 30 minutes for the opioids to start working. They may not reach their full effect for almost 1 hour.    If your provider says it is OK for you to take acetaminophen (Tylenol), you may take 650 mg every 6 hours or 1,000 mg every 8 hours. Doing this will help you taper your opioid dose.    If you did not have a spinal fusion and your provider says it is OK for you to take non-steroidal anti-inflammatory (NSAIDS), you may also take 400 to 600 mg of ibuprofen (Advil, Motrin) every 6 to 8 hours. Do not take NSAIDs if you have heart or kidney disease or a history of gastrointestinal bleeding (bleeding in your digestive tract).    How do I know which pain medicine to take?    This table can help you decide what to take for pain:  Pain level How It Feels What to Do   Mild Pain You feel some pain, but it does not keep you from your daily activities.  · Use Tylenol as needed.  · Use ice as needed (see page 3).   Moderate Pain Pain keeps you from doing the activites your provider has advised.   You need more pain control.  · Take Tylenol 2 to 3 times a day.  · Use ice after activity or when you feel pain (see page 3).  · Take opioids only if needed.   Severe Pain Pain keeps you from doing any activity. You feel you cannot do basic tasks like getting out of bed, getting dressed, or walking to the bathroom. · Take Tylenol every 6 to 8 hours.  · Take opioids as prescribed.  · Use ice every 2 to 3 hours (see page 3).     Pain Control Without Medicines  To help manage your pain:    • Use an ice pack. Every 2 to 3 hours, place an ice pack on the surgical area for about 20 minutes. To protect your skin from damage, place a clean towel between your skin and the ice pack.    • Distract yourself.   Try relaxation, breathing, music, reading, and other ways to  focus your mind on something besides the pain.    For Your Safety  · Do not use heat on your back until incision is fully healed.    • While taking opioids:  ? Do not drink alcohol. Together, opioids and alcohol can make you dizzy and slow your breathing. They can even cause death.    • Do not drive or use machines.    • Store your opioids in a locked place.    • We will give you a prescription for narcan (Naloxone). This drug is used to treat an opioid overdose. If you would like to learn more about narcan, please visit stopoverdose.org.    • Dispose of any leftover opioids safely. [place] has a disposal program for medicines you no longer need. To find a drop-box near you, visit [email or web address]. You can also request a mail-back envelope if it is hard for you to leave home.    What if I have constipation?  Some pain medicines can cause constipation (hard stool). To help with this:    • Eat more fiber. Eat plenty of fresh fruits and green leafy vegetables.    • Drink lots of fluids (6 to 8 full glasses a day).    • Stool softeners such as bisocodyl (Dulcolax), polyethylene glycol (Miralax), senna (Senokot), and docusate (Colace). You can buy these without a prescription. Each of these works differently to treat constipation.Take one or more of these, as directed, while taking opioids.     • Call the clinic for more advice if these methods are not working.    What if I need refills after 6 weeks?  If you are nearing the end of your 6-week prescription, and you feel you still need opioids:    · Talk with your primary care provider (PCP) or your pain clinic.    · Meet with the provider who prescribes your chronic pain medicine before your opioids run out.    Who to Call   • If your pain is not under control or it gets worse, call the Spine Clinic nurse at [phone] and press [phone] when you hear the recording.    • Call the [facility] at [phone] on weekdays if you:    ? Have  questions about your pain medicines  ? Want advice on how to taper your opioid use after surgery    ? Need a refill. You must either pick up your prescription at [facility] or allow 72 business hours to have the written prescription mail to you.    • Call your PCP if you need any other prescriptions filled, such as for a muscle relaxant.    Activities of Daily Living After Spinal Decompression Surgery  Self-care for safety and healing    This handout gives self-care guidelines to follow after spinal decompression surgery. Follow these guidelines to protect your spine and help you recover.    Self-care  Follow the guidelines in this handout for your daily activities. At first, you may need to take a lot of breaks. Be sure to include rest times in our plan for each day.    Also, make sure you:  · Get a good night's sleep.  · Get dressed every day.  · Slowly resume the hobbies or other activities you enjoy.    Protect Your Spine  For 12 weeks, follow the BLTs for bending, lifting, and twisting:    · Bending:   Do not bend your spine.    · Lifting: Do not lift more han 10 pounds. (A gallon of milk weighs almost 9 pounds.) Your doctor will tell you how much you can lift at your follow-up visits.    · Twisting:  Do not twist your back or neck.    Sleeping  · Use a mattress with good support. Sleep in the position that is most comfortable for you.    · If you are wearing a neck brace, it can help to place a small pillow or a rolled towel under your neck.    · When lying on your back, place a pillow under your knees.This will lessen the stress on your back muscles.    · When lying on your side, place a pillow between your legs.    Getting Dressed  Do not twist your upper body when you get dressed and undressed. Wear loose-fitting tops so that you can put them on and take them off without twisting.    Showering  · Do not take a bath, sit in a hot tub, go swimming, or use a sauna until your incision is fully healed.    · If you  have an incision  -  On your back: Do not expose your incision to water for at least 7 days. You may shower 7 days after surgery.      -  On your throat: You may shower 3 days after surgery.    · When you shower:  -  Let soap and water gently run over the incision. Do not rub the incision.   -  Wash and dry as far as you can without bending. Have someone else wash and dry the rest of your body.  -  Apply a new dry gauze dressing after you shower.    Wound Care  · You may see a small amount of drainage from your incision. This should slowly lessen and then stop.    · Keep your incision dry and clean. Change the dry gauze dressing at least once a day.     • Do not apply creams, ointments, lotions or powders to your incision.    • After your incision stops draining, you no longer need to apply a gauze dressing unless needed for comfort.    • Do not peel off any of the skin glue applied during surgery.    · Avoid any movements that might cause your incision to open.     · Do not smoke or use nicotine products. This can slow or prevent wound healing.    Pain Control  · For 12 weeks after your surgery, do not take any non-steroidal anti-inflammatory drugs (NSAIDs) such as ibuprofen (Advil, Motrin) or naproxen (Aleve, Naprosyn). These medicines slow bone growth and healing.     · It is OK to take acetaminophen (Tylenol). Following the dosing instructions on the bottle.    Activities  · Avoid strenuous pushing, pulling, and lifting. Ask someone else for help with activities such as lifting groceries, doing household chores or yard work, or picking up children, pets, and other items.    · For 2 weeks, limit long car rides and flights to prevent a blood clot. If you need top sit in a car or airplane for a long time, stretch or walk for 5 to 10 minutes every 30 to 45 minutes.    · Do not drive if you are waring a neck brace.    ·   Keep moving. Walk 2 to 3 times daily. Gently swing your arms while walking. Start slowly and  increase your distance as you feel stronger.    · Practice good posture to keep your abdominal muscles strong.    · Use helpful devices during your recovery. These may include a shower chair, a shower head that you can hold in your hand, a reach-and-grab tool, a long-handled loofa, a raised toilet seat, and ice packs. Try to get these tools before surgery. You can find many of them online at www.amazon.com or at a medical supply store.    Getting Out of Bed  Use the 3-step "logroll" method to get out of bed.        Sexual Activity  When you can resume sexual activity depends on how quickly you recover after surgery. It is beset to wait until you talk with your doctor at your follow-up visit. Ask when it is OK to start being sexually active. If you have sex, be sure to follow your BLT precautions (see page 1).    When to Call  Call the clinic at [phone] and press 2 if you have questions about your health or have any of these symptoms:    · Fever above 100.0 F (37.8 C)  · More redness, heat, drainage or swelling at your incision  · New or worse pain   · Severe headache   · Feeling very tired   · Change in bowel or bladder control  · Change in numbness or weakness in your arms or legs
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
## 2019-12-10 LAB — R/O MRSA

## 2019-12-11 LAB — R/O MRSA

## 2019-12-17 ENCOUNTER — Ambulatory Visit: Payer: Commercial Managed Care - HMO

## 2019-12-17 NOTE — Progress Notes (Signed)
Department of Labor and [place] Number: [id number]    General Information:    Insurance: State Workers' Comp  Claim Form Submitted: YES  Claim Status: Open/Allowed  Date of Injury: 06/14/2019  Date of First Visit: 11/18/2019  Employer Name/Phone: [employer]   Job of Injury: Environmental manager  Referring Provider: Dorothea Ogle, MD  PCP: Junita Push, PA-C  Attending Provider: Dorothea Ogle MD  Accepted Diagnoses: [id number] [place], S16.1XXA STRAIN MUSCLE FASC & TENDON NECK LEVL INIT ENC, M54.12 RADICULOPATHY CERVICAL REGION    Language: English    Assessment of Return to Work Barriers:    Category: Health Service  Current Work Status: NO  Reported by: Worker  Barriers to Return to Work: Medical Condition    [place] Completed Actions:    Prompted by: Worker  Who was Contacted: Worker  Worker: Met with the patient in our clinic on 12/09/2019  Provider: Work limitations and recovery progress was discussed with Dr. Quintella Reichert.  Education: [employer] Information  Communication: [place] met with pt in clinic. We discussed travel forms, and attending provider vs concurrent care providers. Possible surgery.     Return to Work Plan:     Patient: Follow-up appointment on 01/28/2020    [place]: Review case again on 01/01/2020    Billing Code:     Catastrophic Codes, Face-to-Face: Health Service, 6 minutes

## 2019-12-22 ENCOUNTER — Other Ambulatory Visit (HOSPITAL_COMMUNITY): Payer: Self-pay

## 2020-01-01 ENCOUNTER — Other Ambulatory Visit (HOSPITAL_BASED_OUTPATIENT_CLINIC_OR_DEPARTMENT_OTHER): Payer: Self-pay

## 2020-01-01 ENCOUNTER — Ambulatory Visit (HOSPITAL_COMMUNITY): Payer: Self-pay

## 2020-01-01 ENCOUNTER — Ambulatory Visit: Payer: Commercial Managed Care - HMO | Attending: Internal Medicine

## 2020-01-01 DIAGNOSIS — Z01811 Encounter for preprocedural respiratory examination: Secondary | ICD-10-CM

## 2020-01-01 DIAGNOSIS — Z20822 Contact with and (suspected) exposure to covid-19: Secondary | ICD-10-CM | POA: Insufficient documentation

## 2020-01-01 NOTE — Progress Notes (Signed)
Patient was seen today at the [facility] COVID-19 testing site where a sample of dual nasal pharyngeal collection was taken. The specimen was sent to the [facility] lab for COVID-19 testing. Results will be available within 48 hours. Patient received informational instructions on self-care. The specimen was collected by designated RN/LPN/MA per standing order.

## 2020-01-02 ENCOUNTER — Encounter (HOSPITAL_BASED_OUTPATIENT_CLINIC_OR_DEPARTMENT_OTHER): Payer: Self-pay | Admitting: Orthopaedic Surgery

## 2020-01-02 LAB — COVID-19 CORONAVIRUS QUALITATIVE PCR: COVID-19 Coronavirus Qual PCR Result: NOT DETECTED

## 2020-01-03 ENCOUNTER — Inpatient Hospital Stay (HOSPITAL_COMMUNITY): Payer: No Typology Code available for payment source | Admitting: Orthopaedic Surgery of the Spine

## 2020-01-03 ENCOUNTER — Encounter (HOSPITAL_COMMUNITY): Payer: Self-pay

## 2020-01-03 ENCOUNTER — Inpatient Hospital Stay (HOSPITAL_COMMUNITY): Payer: No Typology Code available for payment source

## 2020-01-03 ENCOUNTER — Encounter (HOSPITAL_COMMUNITY)
Payer: No Typology Code available for payment source | Admitting: Student in an Organized Health Care Education/Training Program

## 2020-01-03 ENCOUNTER — Encounter (HOSPITAL_COMMUNITY)
Admission: RE | Disposition: A | Discharge status: Home / Self Care | Payer: Self-pay | Source: Home / Self Care | Attending: Orthopaedic Surgery of the Spine

## 2020-01-03 ENCOUNTER — Inpatient Hospital Stay
Admission: RE | Admit: 2020-01-03 | Discharge: 2020-01-04 | DRG: 321 | Disposition: A | Discharge status: Home / Self Care | Payer: No Typology Code available for payment source | Attending: Orthopaedic Surgery of the Spine | Admitting: Orthopaedic Surgery of the Spine

## 2020-01-03 ENCOUNTER — Inpatient Hospital Stay (HOSPITAL_COMMUNITY)
Admission: RE | Admit: 2020-01-03 | Payer: No Typology Code available for payment source | Source: Home / Self Care | Admitting: Orthopaedic Surgery of the Spine

## 2020-01-03 ENCOUNTER — Encounter (HOSPITAL_COMMUNITY): Payer: Self-pay | Admitting: Orthopaedic Surgery of the Spine

## 2020-01-03 DIAGNOSIS — M50223 Other cervical disc displacement at C6-C7 level: Secondary | ICD-10-CM

## 2020-01-03 DIAGNOSIS — S29012A Strain of muscle and tendon of back wall of thorax, initial encounter: Secondary | ICD-10-CM

## 2020-01-03 DIAGNOSIS — S161XXA Strain of muscle, fascia and tendon at neck level, initial encounter: Secondary | ICD-10-CM

## 2020-01-03 DIAGNOSIS — M4802 Spinal stenosis, cervical region: Secondary | ICD-10-CM

## 2020-01-03 DIAGNOSIS — M50123 Cervical disc disorder at C6-C7 level with radiculopathy: Secondary | ICD-10-CM | POA: Diagnosis present

## 2020-01-03 DIAGNOSIS — Z981 Arthrodesis status: Secondary | ICD-10-CM | POA: Diagnosis present

## 2020-01-03 DIAGNOSIS — M50023 Cervical disc disorder at C6-C7 level with myelopathy: Principal | ICD-10-CM | POA: Diagnosis present

## 2020-01-03 SURGERY — DISCECTOMY, SPINE, CERVICAL, ANTERIOR APPROACH, WITH FUSION USING INSTRUMENTATION, BY ORTHOPEDICS
Anesthesia: General | Site: Spine Cervical | Wound class: Class I/ Clean

## 2020-01-03 MED ORDER — MELATONIN 3 MG OR TABS
3.0000 mg | ORAL_TABLET | Freq: Every evening | ORAL | Status: DC | PRN
Start: 2020-01-03 — End: 2020-01-04

## 2020-01-03 MED ORDER — SODIUM CHLORIDE 0.9 % IV SOLN
100.0000 mL/h | INTRAVENOUS | Status: DC
Start: 2020-01-03 — End: 2020-01-04
  Administered 2020-01-03: 100 mL/h via INTRAVENOUS

## 2020-01-03 MED ORDER — SENNOSIDES 8.6 MG OR TABS
17.2000 mg | ORAL_TABLET | Freq: Two times a day (BID) | ORAL | Status: DC
Start: 2020-01-03 — End: 2020-01-04
  Administered 2020-01-03 – 2020-01-04 (×2): 17.2 mg via ORAL
  Filled 2020-01-03 (×2): qty 2

## 2020-01-03 MED ORDER — CEFAZOLIN SODIUM 1 G IJ SOLR
INTRAMUSCULAR | Status: AC
Start: 2020-01-03 — End: ?
  Filled 2020-01-03: qty 2

## 2020-01-03 MED ORDER — PHENYLEPHRINE HCL-NACL 25-0.9 MG/250ML-% IV SOLN
INTRAVENOUS | Status: AC
Start: 2020-01-03 — End: ?
  Filled 2020-01-03: qty 250

## 2020-01-03 MED ORDER — LACTATED RINGERS BOLUS
500.0000 mL | Freq: Once | INTRAVENOUS | Status: DC | PRN
Start: 2020-01-03 — End: 2020-01-03

## 2020-01-03 MED ORDER — ACETAMINOPHEN 10 MG/ML IV SOLN
1000.0000 mg | Freq: Once | INTRAVENOUS | Status: AC
Start: 2020-01-03 — End: 2020-01-03
  Administered 2020-01-03: 1000 mg via INTRAVENOUS
  Filled 2020-01-03: qty 100

## 2020-01-03 MED ORDER — FENTANYL CITRATE (PF) 50 MCG/ML IJ SOLN WRAPPER (ANESTHESIA OSM ONLY)
INTRAMUSCULAR | Status: DC | PRN
Start: 2020-01-03 — End: 2020-01-03
  Administered 2020-01-03: 100 ug via INTRAVENOUS
  Administered 2020-01-03: 150 ug via INTRAVENOUS

## 2020-01-03 MED ORDER — HYDROMORPHONE HCL 1 MG/ML IJ SOLN
0.2000 mg | INTRAMUSCULAR | Status: DC | PRN
Start: 2020-01-03 — End: 2020-01-04
  Administered 2020-01-04: 0.4 mg via INTRAVENOUS
  Filled 2020-01-03: qty 1

## 2020-01-03 MED ORDER — VANCOMYCIN HCL 1 G IV SOLR
INTRAVENOUS | Status: AC
Start: 2020-01-03 — End: ?
  Filled 2020-01-03: qty 20

## 2020-01-03 MED ORDER — BUPIVACAINE-EPINEPHRINE 0.25% -1:200000 IJ SOLN
INTRAMUSCULAR | Status: DC | PRN
Start: 2020-01-03 — End: 2020-01-03
  Administered 2020-01-03 (×2): 5 mL via INTRAMUSCULAR

## 2020-01-03 MED ORDER — PLASMA-LYTE A IV SOLN
10.0000 mL/h | INTRAVENOUS | Status: DC
Start: 2020-01-03 — End: 2020-01-04
  Administered 2020-01-03: 10 mL/h via INTRAVENOUS

## 2020-01-03 MED ORDER — HYDROMORPHONE HCL 1 MG/ML IJ SOLN
0.2000 mg | INTRAMUSCULAR | Status: DC | PRN
Start: 2020-01-03 — End: 2020-01-03

## 2020-01-03 MED ORDER — REMIFENTANIL HCL 2 MG IV SOLR
INTRAVENOUS | Status: DC | PRN
Start: 2020-01-03 — End: 2020-01-03
  Administered 2020-01-03: 160 ug via INTRAVENOUS

## 2020-01-03 MED ORDER — THROMBIN (RECOMBINANT) 5000 UNITS EX SOLR
CUTANEOUS | Status: AC
Start: 2020-01-03 — End: ?
  Filled 2020-01-03: qty 5000

## 2020-01-03 MED ORDER — SUCCINYLCHOLINE CHLORIDE 200 MG/10ML IV SOSY
PREFILLED_SYRINGE | INTRAVENOUS | Status: AC
Start: 2020-01-03 — End: ?
  Filled 2020-01-03: qty 10

## 2020-01-03 MED ORDER — HYDROMORPHONE HCL 1 MG/ML IJ SOLN
INTRAMUSCULAR | Status: AC
Start: 2020-01-03 — End: ?
  Filled 2020-01-03: qty 1

## 2020-01-03 MED ORDER — OXYCODONE HCL 5 MG OR TABS
5.0000 mg | ORAL_TABLET | ORAL | Status: DC | PRN
Start: 2020-01-03 — End: 2020-01-04
  Administered 2020-01-03 (×2): 5 mg via ORAL
  Filled 2020-01-03 (×2): qty 1

## 2020-01-03 MED ORDER — ONDANSETRON HCL 4 MG/2ML IJ SOLN
4.0000 mg | INTRAMUSCULAR | Status: DC | PRN
Start: 2020-01-03 — End: 2020-01-03

## 2020-01-03 MED ORDER — ONDANSETRON HCL 4 MG/2ML IJ SOLN
INTRAMUSCULAR | Status: AC
Start: 2020-01-03 — End: ?
  Filled 2020-01-03: qty 2

## 2020-01-03 MED ORDER — CYCLOBENZAPRINE HCL 5 MG OR TABS
5.0000 mg | ORAL_TABLET | Freq: Three times a day (TID) | ORAL | Status: DC | PRN
Start: 2020-01-03 — End: 2020-01-04

## 2020-01-03 MED ORDER — DEXAMETHASONE SODIUM PHOSPHATE 4 MG/ML IJ SOLN
INTRAMUSCULAR | Status: AC
Start: 2020-01-03 — End: ?
  Filled 2020-01-03: qty 1

## 2020-01-03 MED ORDER — PROPOFOL 1000 MG/100ML IV EMUL
INTRAVENOUS | Status: AC
Start: 2020-01-03 — End: ?
  Filled 2020-01-03: qty 300

## 2020-01-03 MED ORDER — ONDANSETRON HCL 4 MG/2ML IJ SOLN
INTRAMUSCULAR | Status: DC | PRN
Start: 2020-01-03 — End: 2020-01-03
  Administered 2020-01-03: 4 mg via INTRAVENOUS

## 2020-01-03 MED ORDER — THROMBIN (RECOMBINANT) 5000 UNITS EX SOLR
INTRAMUSCULAR | Status: DC | PRN
Start: 2020-01-03 — End: 2020-01-03
  Administered 2020-01-03: 40 mL via TOPICAL

## 2020-01-03 MED ORDER — POLYETHYLENE GLYCOL 3350 17 G OR PACK
17.0000 g | PACK | Freq: Every day | ORAL | Status: DC
Start: 2020-01-03 — End: 2020-01-04
  Administered 2020-01-03: 17 g via ORAL
  Filled 2020-01-03 (×2): qty 1

## 2020-01-03 MED ORDER — MIDAZOLAM HCL 2 MG/2ML IJ SOLN
INTRAMUSCULAR | Status: AC
Start: 2020-01-03 — End: ?
  Filled 2020-01-03: qty 2

## 2020-01-03 MED ORDER — HYDROMORPHONE HCL 1 MG/ML IJ SOLN
INTRAMUSCULAR | Status: DC | PRN
Start: 2020-01-03 — End: 2020-01-03
  Administered 2020-01-03: 1 mg via INTRAVENOUS
  Administered 2020-01-03 (×2): 0.5 mg via INTRAVENOUS

## 2020-01-03 MED ORDER — KETAMINE 100 MG IN NS 100 ML INFUSION (PKG PREMIX)
Status: AC
Start: 2020-01-03 — End: ?
  Filled 2020-01-03: qty 100

## 2020-01-03 MED ORDER — PHENYLEPHRINE HCL-NACL 25-0.9 MG/250ML-% IV SOLN
INTRAVENOUS | Status: DC | PRN
Start: 2020-01-03 — End: 2020-01-03
  Administered 2020-01-03: 0.2 ug/kg/min via INTRAVENOUS

## 2020-01-03 MED ORDER — KETAMINE 100 MG IN NS 100 ML INFUSION (PKG PREMIX)
15.0000 mg/h | Status: DC
Start: 2020-01-03 — End: 2020-01-04
  Administered 2020-01-03: 30 mg/h via INTRAVENOUS

## 2020-01-03 MED ORDER — GELATIN ABSORBABLE MT POWD
OROMUCOSAL | Status: DC | PRN
Start: 2020-01-03 — End: 2020-01-03
  Administered 2020-01-03: 1 g via TOPICAL

## 2020-01-03 MED ORDER — SUFENTANIL CITRATE 250 MCG/5ML IV SOLN
INTRAVENOUS | Status: AC
Start: 2020-01-03 — End: ?
  Filled 2020-01-03: qty 10

## 2020-01-03 MED ORDER — PROPOFOL 200 MG/20ML IV EMUL
INTRAVENOUS | Status: AC
Start: 2020-01-03 — End: ?
  Filled 2020-01-03: qty 20

## 2020-01-03 MED ORDER — REMIFENTANIL HCL 5 MG IV SOLR
INTRAVENOUS | Status: AC
Start: 2020-01-03 — End: ?
  Filled 2020-01-03: qty 5

## 2020-01-03 MED ORDER — LIDOCAINE HCL (PF) 2 % IJ SOLN
INTRAMUSCULAR | Status: AC
Start: 2020-01-03 — End: ?
  Filled 2020-01-03: qty 5

## 2020-01-03 MED ORDER — SURGIFOAM MT POWD
OROMUCOSAL | Status: AC
Start: 2020-01-03 — End: ?
  Filled 2020-01-03: qty 2

## 2020-01-03 MED ORDER — LIDOCAINE HCL 2 % IJ SOLN
INTRAMUSCULAR | Status: DC | PRN
Start: 2020-01-03 — End: 2020-01-03
  Administered 2020-01-03: 100 mg via INTRAVENOUS

## 2020-01-03 MED ORDER — SODIUM CHLORIDE 0.9 % IR SOLN
Status: DC | PRN
Start: 2020-01-03 — End: 2020-01-03
  Administered 2020-01-03: 1000 mL

## 2020-01-03 MED ORDER — MIDAZOLAM HCL (PF) 1 MG/ML IJ SOLN WRAPPER (ANESTHESIA OSM ONLY)
INTRAMUSCULAR | Status: DC | PRN
Start: 2020-01-03 — End: 2020-01-03
  Administered 2020-01-03: 2 mg via INTRAVENOUS

## 2020-01-03 MED ORDER — HALOPERIDOL LACTATE 5 MG/ML IJ SOLN
1.0000 mg | Freq: Once | INTRAMUSCULAR | Status: DC | PRN
Start: 2020-01-03 — End: 2020-01-03

## 2020-01-03 MED ORDER — ACETAMINOPHEN 325 MG OR TABS
650.0000 mg | ORAL_TABLET | Freq: Four times a day (QID) | ORAL | Status: DC
Start: 2020-01-03 — End: 2020-01-04
  Administered 2020-01-03 – 2020-01-04 (×4): 650 mg via ORAL
  Filled 2020-01-03 (×4): qty 2

## 2020-01-03 MED ORDER — DEXAMETHASONE SODIUM PHOSPHATE 4 MG/ML IJ SOLN
INTRAMUSCULAR | Status: DC | PRN
Start: 2020-01-03 — End: 2020-01-03
  Administered 2020-01-03: 4 mg via INTRAVENOUS

## 2020-01-03 MED ORDER — SODIUM CHLORIDE 0.9 % IV SOLN
INTRAVENOUS | Status: DC | PRN
Start: 2020-01-03 — End: 2020-01-03
  Administered 2020-01-03: 0.1 ug/kg/min via INTRAVENOUS

## 2020-01-03 MED ORDER — FENTANYL CITRATE (PF) 250 MCG/5ML IJ SOLN
INTRAMUSCULAR | Status: AC
Start: 2020-01-03 — End: ?
  Filled 2020-01-03: qty 5

## 2020-01-03 MED ORDER — PROPOFOL 10 MG/ML IV EMUL WRAPPER (OSM ONLY)
INTRAVENOUS | Status: DC | PRN
Start: 2020-01-03 — End: 2020-01-03
  Administered 2020-01-03: 50 mg via INTRAVENOUS

## 2020-01-03 MED ORDER — FENTANYL CITRATE (PF) 250 MCG/5ML IJ SOLN
12.5000 ug | INTRAMUSCULAR | Status: DC | PRN
Start: 2020-01-03 — End: 2020-01-03

## 2020-01-03 MED ORDER — CEFAZOLIN SODIUM 1 G IJ SOLR
INTRAMUSCULAR | Status: DC | PRN
Start: 2020-01-03 — End: 2020-01-03
  Administered 2020-01-03: 2 g via INTRAVENOUS

## 2020-01-03 MED ORDER — SYRINGE (ANESTHESIA OSM ONLY)
INTRAVENOUS | Status: DC | PRN
Start: 2020-01-03 — End: 2020-01-03
  Administered 2020-01-03: 100 ug/kg/min via INTRAVENOUS

## 2020-01-03 MED ORDER — BUPIVACAINE-EPINEPHRINE 0.25% -1:200000 IJ SOLN
INTRAMUSCULAR | Status: AC
Start: 2020-01-03 — End: ?
  Filled 2020-01-03: qty 10

## 2020-01-03 SURGICAL SUPPLY — 28 items
ADHESIVE SKIN DERMABOND .36 ML (Other) IMPLANT
BLANKET WARM BAIR HUGGER LOWER BODY NOVAPLUS (Other) ×2 IMPLANT
BURR MIDAS REX LEGEND 3MM 14CM MATCH HEAD FLUTE (Bur) ×2 IMPLANT
CATHETER TRAY SURESTEP 16FR 350ML FOLEY (Catheter) ×2 IMPLANT
DISSECTOR KITTNER SECTO (Sponge) ×2 IMPLANT
DRAIN 10FR 1/8IN FULL FLUTE RND 1/8IN (Drain)
DRAIN BARD 10FR 1/8IN 1/8IN FULL FLUTE (Drain) IMPLANT
DRAPE CLOTH 54IN (Drape) ×1 IMPLANT
DRAPE CLOTH 54INCH (Drape) ×2
DRAPE EQUIP OPMI 154INX52IN MICROSCOPE PENTERO (Drape) ×2 IMPLANT
EVACUATOR LTWT LOW LEVEL SUCTION STERILE LF DISP (Drain) IMPLANT
HALTER PADDED HEAD DELUXE UNIV PROCARE (Cautery) ×2 IMPLANT
LINEN PACK (Other) ×2 IMPLANT
LUBRICANT MR7 MIDAS REX DIFFUSER CARTRIDGE (Other) ×2 IMPLANT
MILL BONE BIOPSY MED COARSE BLADE 5MM LF DISP (Knife) IMPLANT
PACK CUST SPINE (Pack) ×2 IMPLANT
PEN SURG MARKING WRITESITE PLUS 3X DARKER VIOLET (Other) ×2 IMPLANT
PLATE CSLP SPINE CERV 1LEVEL CLOVERLEAF CAUDAL HOLE 26MMX21MMX2MM (Plate) ×2 IMPLANT
SCREW SPINE 1.8MM 5MM LOCK 497.78 (Screw) ×8 IMPLANT
SCREW SPINE CSLP 4.35MM 16MM SELF TAP 487.056 (Screw) ×8 IMPLANT
SLEEVE PNEUMATIC KNEE MEDIUM REPROCESSED (Other) ×1 IMPLANT
SLEEVE SCD KNEE MEDIUM (Other) ×1
SUTURE CAPROSYN 3-0 P-12 18IN UNDYED (Suture) ×2 IMPLANT
SUTURE DERMALON 3-0 C-14 30IN BLUE (Suture) ×2 IMPLANT
SUTURE POLYSORB 2-0 5X18 GS-21 D-TACH UNDYED (Suture) ×2 IMPLANT
SUTURE POLYSORB 3-0 V-20 18IN UNDYED (Suture) ×2 IMPLANT
TISSUE FREEZE DRY ALLOGRAFT SPACER 7D 12.5MMX15MMX8MM ANTER CERV FUSION ×2 IMPLANT
WATER STERILE 1000ML PLSTC POUR BOTTLE LF (Solution) ×2 IMPLANT

## 2020-01-03 NOTE — Procedure Nursing Note (Signed)
WIFE UPDATED ON PROCEDURE START VIA TELEPHONE

## 2020-01-03 NOTE — Brief Op Note (Signed)
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    ASA: ASA status not filed in the log.  Estimated Blood Loss: 25cc    SPECIMENS:   No specimens were documented in this log.  - PT/OT when tolerated  - Spine Precautions: None - Treated  - Brace: [place] J for comfort  - HOB restrictions:  None  - MAP goals:  None  - Radiology: [  ] MRI stat to review complete removal of C6-7 disc [ ]  UR XRs  - Activity:  Up ad lib. No bending, twisting, lifting >10lbs x 3 months post op  - Wound Care: POD5  - Nutrition: General diet  - Antibiotics: 24hrs ancef   - Anticoagulation:  SCDs  - Pain plan: IV and PO narcotics prn; please hold NSAIDs x 12 weeks post op  - TLD:    JP - x1 anterior cervical  - FU in Ortho Spine clinic 3 weeks post op for repeat upright xrays, wound check, suture removal   - Dispo: MedSurg overnight

## 2020-01-03 NOTE — Anesthesia Procedure Notes (Signed)
PERIPHERAL IV    Staff:  Performing provider: Cherene Julian, MD  Authorizing provider: Lucienne Capers, MD    Procedure Indication/Location:  Patient location: OR/Procedural area  Indication for IV: for care provided by anesthesia    Placement:  Laterality: left  Location: hand  Size: 16 G  Total attempts for all operators: 1    Date and Time Placed:   01/03/2020 8:00 AM

## 2020-01-03 NOTE — Anesthesia Postprocedure Evaluation (Signed)
Patient: Josimar Corning    Procedure Summary     Date: 01/03/20 Room / Location: [facility] MAIN OR 10 / [facility] MAIN OR    Anesthesia Start: 0731 Anesthesia Stop: 1143    Procedure: C6-7 discectomy and C6-7 anterior interbody fusion (N/A Spine Cervical) Diagnosis:       Cervical disc syndrome      (C6-7 large cervical disc herniation)    Surgeons: Harrold Donath, MD Responsible Provider: Lucienne Capers, MD    Anesthesia Type: general ASA Status: 1        Final Anesthesia Type: general    Vitals Value Taken Time   BP 131/88 01/03/20 1147   Temp 37 C 01/03/20 1158   Pulse 98 01/03/20 1158   SpO2 97 % 01/03/20 1158   Vitals shown include unvalidated device data.    Place of evaluation: PACU    Patient participation: patient participated    Level of consciousness: fully conscious    Patient pain control satisfaction: patient is satisfied with level of pain control    Airway patency: patent    Cardiovascular status during assessment: stable    Respiratory status during assessment: breathing comfortably    Anesthetic complications: no    Intravascular volume status assessment: euvolemic    Nausea / vomiting: patient is not experiencing nausea      Planned post-operative disposition at time of assessment: ward care

## 2020-01-03 NOTE — Procedure Nursing Note (Signed)
WIFE UPDATED ON PROCEDURE PROGRESS VIA TELEPHONE

## 2020-01-03 NOTE — Anesthesia Procedure Notes (Signed)
Arterial Line Placement    Staff:  Performing provider: Cherene Julian, MD  Authorizing provider: Lucienne Capers, MD    Procedure Indication/Location:  Patient location: OR/Procedural area  Indication: continuous blood pressure monitoring    Preparation:  Patient position: supine  Patient level of consciousness: under general anesthesia  Skin prep: chlorhexidine  Sterile barriers: sterile gloves, mask, sterile drape and probe cover    Procedure:  Laterality: left  Site: radial  Catheter size: 20 G  Catheter type: arrow  Guidewire used: yes  Ultrasound-guided: localize potential vessel for access, guidance to enter vessel and demonstrate vessel latency  Ultrasound image captured: no  Line secured: tape and Tegaderm    Events:  Total attempts: 1  Complications: patient tolerated procedure well with no complications.    Date and Time Placed:  01/03/2020 8:05 AM

## 2020-01-03 NOTE — Anesthesia Procedure Notes (Signed)
PERIPHERAL IV    Staff:  Performing provider: Cherene Julian, MD  Authorizing provider: Lucienne Capers, MD    Procedure Indication/Location:  Patient location: OR/Procedural area  Indication for IV: for care provided by anesthesia    Placement:  Laterality: right  Location: hand  Size: 16 G  Total attempts for all operators: 1    Date and Time Placed:   01/03/2020 8:05 AM

## 2020-01-03 NOTE — Progress Notes (Signed)
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    DOS: 01/03/2020: C6-C7 discectomy and C6-C7 ACDF Quintella Reichert)       SUBJECTIVE     Surgical/Procedural Cases on this Admission     Case IDs Date Procedure Surgeon Location Status    [id number] 01/03/20 C6-7 discectomy and C6-7 anterior interbody fusion Quintella Reichert, Carlo, MD [facility] MAIN OR [facility]          INTERVAL HISTORY:  NAEON, doing well post-operatively.    SCHEDULED MEDICATIONS:   .  acetaminophen, 650 mg, q6h SCH  .  polyethylene glycol 3350, 17 g, Daily  .  senna, 17.2 mg, BID    INFUSED MEDICATIONS:  ketamine, 15 mg/hr, Continuous, Last Rate: Stopped (01/03/20 1142)  .  Plasma-Lyte A, 10 mL/hr, Continuous, Last Rate: Stopped (01/03/20 1142)  .  sodium chloride, 100 mL/hr, Continuous, Last Rate: 100 mL/hr (01/03/20 1639)     PRN MEDICATIONS:  cyclobenzaprine, 5 mg, q8h PRN  .  HYDROmorphone, 0.2-0.4 mg, q4h PRN  .  melatonin, 3 mg, q HS PRN  .  oxyCODONE, 5-15 mg, q3h PRN       OBJECTIVE     Vitals (Most recent in last 24 hrs)     T: 36.6 C (01/03/20 1559)  BP: (!) 131/91 (01/03/20 1559)  HR: 85 (01/03/20 1559)  RR: 16 (01/03/20 1559)  SpO2: 96 % (01/03/20 1559)  T range: Temp  Min: 36.4 C  Max: 37 C  Admit weight: (not recorded)  Last weight: (not recorded)         I&Os:     Intake/Output Summary (Last 24 hours) at 01/03/2020 1756  Last data filed at 01/03/2020 1522  Intake 1451.02 ml   Output 1500 ml   Net -48.98 ml       Physical Exam  Gen: Cooperative and well appearing, NAD  Pulm: nonlabored breathing  Neuro: answers questions appropriately     Upper Extremity:    R                L      C5 [elbow flexion]:    5         5                   C6 [elbow flexion/wrist extension]:  5         5                        C7 [elbow  extension/wrist flexion]:  5         5  C8 [finger flexion]:    5         5   T1 [grip strength/intrinsics]:   5         5     Lower Extremity:    R                 L   L2 [hip flexion]:     5         5  L3 [knee extension]:    5         5  L4 [ankle dorsiflexion]:    5  5  L5 [EHL]:     5         5  S1 [ankle plantar flexion]:   5         5    Sensation:  -R: SILT C5-T1; L2-S1  -L: SILT  C5-T1; L2-S1      Vascular:  Fingers/digits warm and well perfused bilaterally       Labs (last 24 hours):   Chemistries  CBC  LFT  Gases, other   - - - -   -   AST: - ALT: -  -/-/-/-  -/-/-/-   - - -   - >< -  AP: - T bili: -  Lact (a): - Lact (v): -   eGFR: - Ca: -   -   Prot: - Alb: -  Trop I: - D-dimer: -   Mg: - PO4: -  ANC: -     BNP: - Anti-Xa: -     ALC: -    INR: -             ASSESSMENT/PLAN       SURGERY: 4/19: C6-C7 discectomy and C6-C7 ACDF    ATTENDING: Quintella Reichert    ASSESSMENT:   Cody Hart is a 39 year old male s/p above procedure.    PLAN:   Activity: Up ad lib  No bending, twisting, lifting >10lbs x 3 months post op  PT/OT:   Spine treated, Miami-J  Brace:  [place]-J  HOB restrictions:  None  MAP goals:  none    Antibiotics: Ancef post op x 2 doses   Anticoagulation:  Mechanical.  Please hold chemical DVT ppx until drain out.  If mobilizing well, do not start chemical DVT ppx.  Pain plan: scheduled acetaminophen, PO and IV pain narcotics prn; please hold NSAIDs x 12 weeks post op.   TLD: [ ]  JP x1  Nutrition: General diet  Wound Care: Keep dressing in place until POD3 and then may be changed PRN if saturated or soiled    Imaging: [  ] MRI stat to review complete removal of C6-7 disc [ ]  UR XRs    Follow up: F/U in Ortho Spine clinic 3 weeks post op for repeat upright xrays, wound check, suture removal   Date: 5/13    Dispo: pending

## 2020-01-03 NOTE — Procedure Nursing Note (Signed)
DR. BEATTY TO SPEAK WITH WIFE POST PROCEDURE

## 2020-01-03 NOTE — Anesthesia Procedure Notes (Signed)
Airway Placement    Staff:  Performing Provider: Cherene Julian, MD  Authorizing Provider: Lucienne Capers, MD    Airway management:   Patient location: OR/Procedural area  Final airway type: endotracheal airway  Intubation reason: general anesthesia    Induction:  Positioning: supine  Patient was pre-oxygenated: yes  Mask Ventilation: Grade 2 - Ventilated by mask with oral airway/adjuvant     Intubation:  Attempt 1  Airway Type: ETT  Primary Laryngoscopy: Macintosh  Blade Size: 2  Intubation Adjuncts: bougie    Attempt 2  Airway Type: ETT  Primary Laryngoscopy: Macintosh  Blade Size: 4  Laryngoscopic View: Grade II  Intubation Adjuncts: bougie  ETT Type: standard, cuffed  ETT Route: oral  Size: 7.5  ETT secured with adhesive tape  Depth at: teeth    Number of Attempts: 2    Assessment:  Confirmation: auscultation and waveform capnography  Procedure Abandoned: no    Date / Time Airway Secured / Re-Secured:  01/03/2020 7:45 AM    Final procedure comments:  Very posterior arytenoids visible, repositioned between attempts, attending 2nd attempt

## 2020-01-04 ENCOUNTER — Inpatient Hospital Stay (HOSPITAL_COMMUNITY): Payer: No Typology Code available for payment source

## 2020-01-04 ENCOUNTER — Other Ambulatory Visit (HOSPITAL_BASED_OUTPATIENT_CLINIC_OR_DEPARTMENT_OTHER): Payer: Self-pay

## 2020-01-04 DIAGNOSIS — Z981 Arthrodesis status: Secondary | ICD-10-CM

## 2020-01-04 DIAGNOSIS — M50321 Other cervical disc degeneration at C4-C5 level: Secondary | ICD-10-CM

## 2020-01-04 MED ORDER — SENNOSIDES 8.6 MG OR TABS
17.2000 mg | ORAL_TABLET | Freq: Two times a day (BID) | ORAL | 0 refills | Status: AC
Start: 2020-01-04 — End: ?
  Filled 2020-01-04: qty 24, 6d supply, fill #0

## 2020-01-04 MED ORDER — METHOCARBAMOL 500 MG OR TABS
500.0000 mg | ORAL_TABLET | Freq: Four times a day (QID) | ORAL | 0 refills | Status: DC
Start: 2020-01-04 — End: 2020-01-04
  Filled 2020-01-04: qty 60, 15d supply, fill #0

## 2020-01-04 MED ORDER — BISACODYL 5 MG OR TBEC
5.0000 mg | DELAYED_RELEASE_TABLET | Freq: Every evening | ORAL | Status: DC | PRN
Start: 2020-01-04 — End: 2020-01-04

## 2020-01-04 MED ORDER — GABAPENTIN 300 MG OR CAPS
300.0000 mg | ORAL_CAPSULE | Freq: Three times a day (TID) | ORAL | Status: DC
Start: 2020-01-04 — End: 2020-01-04
  Administered 2020-01-04: 300 mg via ORAL
  Filled 2020-01-04 (×2): qty 1

## 2020-01-04 MED ORDER — METHOCARBAMOL 500 MG OR TABS
500.0000 mg | ORAL_TABLET | Freq: Four times a day (QID) | ORAL | 0 refills | Status: AC
Start: 2020-01-04 — End: ?
  Filled 2020-01-04: qty 60, 15d supply, fill #0

## 2020-01-04 MED ORDER — ALPRAZOLAM 0.5 MG OR TABS
0.5000 mg | ORAL_TABLET | Freq: Three times a day (TID) | ORAL | 0 refills | Status: DC | PRN
Start: 2020-01-04 — End: 2020-01-14
  Filled 2020-01-04: qty 6, 2d supply, fill #0

## 2020-01-04 MED ORDER — OXYCODONE HCL 5 MG OR TABS
5.0000 mg | ORAL_TABLET | ORAL | Status: DC | PRN
Start: 2020-01-04 — End: 2020-01-04

## 2020-01-04 MED ORDER — BISACODYL 10 MG RE SUPP
10.0000 mg | Freq: Every day | RECTAL | Status: DC | PRN
Start: 2020-01-04 — End: 2020-01-04

## 2020-01-04 MED ORDER — ALPRAZOLAM 0.5 MG OR TABS
0.5000 mg | ORAL_TABLET | Freq: Three times a day (TID) | ORAL | 0 refills | Status: DC | PRN
Start: 2020-01-04 — End: 2020-01-04
  Filled 2020-01-04: qty 6, 2d supply, fill #0

## 2020-01-04 MED ORDER — DEXAMETHASONE SODIUM PHOSPHATE 4 MG/ML IJ SOLN
4.0000 mg | Freq: Three times a day (TID) | INTRAMUSCULAR | Status: DC
Start: 2020-01-04 — End: 2020-01-04
  Administered 2020-01-04: 4 mg via INTRAVENOUS
  Filled 2020-01-04: qty 1

## 2020-01-04 MED ORDER — SENNOSIDES 8.6 MG OR TABS
17.2000 mg | ORAL_TABLET | Freq: Two times a day (BID) | ORAL | 0 refills | Status: DC
Start: 2020-01-04 — End: 2020-01-04
  Filled 2020-01-04: qty 24, 6d supply, fill #0

## 2020-01-04 MED ORDER — ACETAMINOPHEN 325 MG OR TABS
650.0000 mg | ORAL_TABLET | Freq: Four times a day (QID) | ORAL | 0 refills | Status: DC
Start: 2020-01-04 — End: 2020-01-04
  Filled 2020-01-04: qty 60, 8d supply, fill #0

## 2020-01-04 MED ORDER — OXYCODONE HCL 5 MG OR TABS
5.0000 mg | ORAL_TABLET | ORAL | 0 refills | Status: DC | PRN
Start: 2020-01-04 — End: 2020-01-14
  Filled 2020-01-04: qty 42, 4d supply, fill #0

## 2020-01-04 MED ORDER — POLYETHYLENE GLYCOL 3350 17 GM/SCOOP OR POWD
17.0000 g | Freq: Every day | ORAL | 0 refills | Status: DC
Start: 2020-01-05 — End: 2020-01-04
  Filled 2020-01-04: qty 238, 14d supply, fill #0

## 2020-01-04 MED ORDER — ALPRAZOLAM 0.5 MG OR TABS
0.5000 mg | ORAL_TABLET | Freq: Three times a day (TID) | ORAL | Status: DC | PRN
Start: 2020-01-04 — End: 2020-01-04
  Administered 2020-01-04: 0.5 mg via ORAL
  Filled 2020-01-04: qty 1

## 2020-01-04 MED ORDER — ACETAMINOPHEN 325 MG OR TABS
650.0000 mg | ORAL_TABLET | Freq: Four times a day (QID) | ORAL | 0 refills | Status: DC
Start: 2020-01-04 — End: 2020-01-14
  Filled 2020-01-04: qty 60, 8d supply, fill #0

## 2020-01-04 MED ORDER — OXYCODONE HCL 5 MG OR TABS
5.0000 mg | ORAL_TABLET | ORAL | 0 refills | Status: DC | PRN
Start: 2020-01-04 — End: 2020-01-04
  Filled 2020-01-04: qty 42, 4d supply, fill #0

## 2020-01-04 MED ORDER — POLYETHYLENE GLYCOL 3350 17 GM/SCOOP OR POWD
17.0000 g | Freq: Every day | ORAL | 0 refills | Status: AC
Start: 2020-01-05 — End: ?
  Filled 2020-01-04: qty 238, 14d supply, fill #0

## 2020-01-04 MED ORDER — METHOCARBAMOL 500 MG OR TABS
500.0000 mg | ORAL_TABLET | Freq: Four times a day (QID) | ORAL | Status: DC
Start: 2020-01-04 — End: 2020-01-04
  Administered 2020-01-04 (×2): 500 mg via ORAL
  Filled 2020-01-04 (×2): qty 1

## 2020-01-04 NOTE — Progress Notes (Signed)
CCN met with pt for care coordination and dc planning. Pt prefers to set up a schedule with PCP on his own. His wife Anderson Malta can pick him up from [facility], they will be staying in a hotel in [place] then drive to [employer]. Prefers to fill meds at [facility].   01/04/20 1244   Patient Information   Primary Caregiver Self   Information Obtained From Patient   Living situation prior to admit Home  (lives with wife, abel to provide assistance)   Support Systems Spouse/significant other   Case Management Care Plan Interventions   Assessment Type Discharge Planning Assessment ( Face-to-Face)   CM Interventions Discharge planning   Vulnerability Screening   Functional Limitations Prior to Admit No Limitations   Access to Healthcare prior to Admit No/minimal barriers to healthcare  (pt prefers to set up his own PCP appointment)   Education Information   Patient Verbal Yes

## 2020-01-04 NOTE — Progress Notes (Signed)
Physical Therapy  Physical Therapy Evaluation/Treatment/Discharge    Patient Name: Cody Hart  MRN: [id number]  Today's Date: 01/04/2020    General Visit Information  General  Documentation Type: Initial Eval  Treatment Start Time: 0919  Treatment End Time: 0950  Treatment Duration (min): 31 Mins  Family/Caregiver Present: No  PT Daily Recommendations: I on unit and in room    Subjective  Subjective  Patient Report/Self-Assessment: Pt reports very rough morning, reports his emotional response has been unusual for him.  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 s/p 01/03/2020:C6-C7 discectomy and C6-C7 ACDF     Patient Active Problem List   Diagnosis    S/P cervical spinal fusion       Past Medical History:   Diagnosis Date    Anxiety     Depression     Thyroid disease        Past Surgical History:   Procedure Laterality Date    Bilat hand surgeries including L middle finger amputation      Facial surgery age 87      Left foot surgery x3      Left knee surgery      Rebuild Left foot         Home Living  Home Living  Type of Home: House  Entry Stairs: 0  Lives With: Spouse    Prior Level of Function  Prior Function  Prior Level of Function: Other (Comment)  Details on Prior Level of Function: due to poor fine motor, hand strength and coordination had to leave job.  Type of Occupation: electrical      Pain  Pain Assessment  Pain Assessment: No/denies pain  Pain Score: 2  Pain Descriptor Scale: Mild  Pain Intensity: Activity  Pain Location: neck  Pain Descriptors: Aching    Cognition  Cognition  Overall Cognitive Status: Within Functional Limits  Arousal/Alertness: Appropriate responses to stimuli  Orientation Level: Oriented X4  Following Commands: Follows all commands and directions without difficulty  Safety Judgment: Good awareness of safety precautions  Awareness of Errors: Good  awareness of errors made  Deficits: Fully aware of deficits  Attention Span: Appears intact    Extremity Assessments  RUE Assessment  RUE Assessment: Within Functional Limits              LUE Assessment  LUE Assessment: Exceptions to [facility]  LUE Assessment Comments: reports numbness in webspace, reports improved from former of large portion of LUE prior to surgery               RLE Assessment  RLE Assessment: Within Functional Limits              LLE Assessment  LLE Assessment: Within Functional Limits                Sensation  Sensation  Sensation Comments: reports loss of sensation on left webspace, and left foot from prior surgery.  Proprioception     Coordination     Perception     Vision/Vestibular     Anthropometric Measurements     Static Sitting Balance  Static Sitting Balance  Static Sitting-Balance Support: No upper extremity supported  Static Sitting Level of Assistance: Independent  Dynamic Sitting Balance  Dynamic Sitting Balance  Dynamic Sitting-Balance Support: No upper extremity supported  Dynamic Sitting Balance Level of Assist: Independent  Static Standing Balance  Static Standing Balance  Static Standing Balance: No upper extremity supported  Static Standing Balance Level of Assistance: Independent  Dynamic Standing Balance  Dynamic Standing Balance  Dynamic Standing Balance: No upper extremity supported  Dynamic Standing Balance Level of Assist: Independent  Activity Tolerance  Activity Tolerance  Endurance: Endurance does not limit participation in activity    Bed Mobility  Bed Mobility  Supine>Sit: Independent  Transfers  Transfers  Sit<>Stand: Barrister's clerk  Distance: 250'  Assistance Needs: Independent  Assistive Device Used: None  10 Meter Walk Test     15 Foot Walk Test     2 Minute Walk Test     6 Minute Walk Test     Stairs       Outcome Measures          Treatment  Therapeutic Exercise     Therapeutic Activity  Therapeutic  Activity  Therapeutic Activity Time Entry: 15  Therapeutic Activity 1: posture, bodymechanics with functional mobility for safety.  Neuromuscular Re-Education     Gait Training     Other Activities       Assessment/Plan  PT Assessment Results: Decreased range of motion  Impact on Function and Participation: Pt seen for initial evaluation . Pt eager to work with therapy and eager to return home. Pt able to perform mobility with supervision initially and progress to independent with cues for safety, posture. Pt with no further Acute PT needs but will benefit from ongoing skilled therapy -Work hardening to return to work when appropriate.   Prognosis: Excellent  Barriers to Discharge: none  Evaluation/Treatment Tolerance: Patient tolerated treatment well             PT Discharge Recommendations: Other (Comment)(OP PT for work hardening after recovery from surgery)             [place], PT  01/04/2020

## 2020-01-04 NOTE — Progress Notes (Signed)
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 s/p 01/03/2020:C6-C7 discectomy and C6-C7 ACDF     Patient was seen by PT and found to be neuro intact and pt independently completed lower body dressing cares.  Patient does not have acute OT needs- OT will sign off.

## 2020-01-04 NOTE — Nursing Note (Signed)
Patient Summary   Pt is a 39 yo male s/p cervical spinal fusion.   4/19: C6-C7 discectomy and C6-C7 ACDF.  A&O x4, VSS. Wears miami J for comfort. Reports tingling to tips of fingers, otherwise MAE. OOB and beginning of shift to the BR, voiding. Pt reports increased pain and some difficulty swallowing this AM. Medicated with Dilaudid 0.4 mg IV with good relief. Tolerating thin liquids.    Edited by: Denton Ar at 01/04/2020 0610    Illness Severity  Stable  Edited byMarland Kitchen Denton Ar at 01/04/2020 [phone]

## 2020-01-04 NOTE — Discharge Instructions (Signed)
Activity:  Brace: wear for comfort only.  -Mobilize as tolerated  -No lifting >10lbs  -Avoid deep bending and twisting through the spine  -Avoid aggressive pulling/pushing  -No high impact activities x3 months     Pain:  -The opioids you have been prescribed are for short term use only to treat your acute postoperative pain. This pain should decrease every day as your body recovers from the surgery and inflammation decreases. Most postoperative pain resolves completely within 1-2 weeks after surgery. You are expected to taper your opioids accordingly and be off of them completely within 1-2 weeks. Taper your opioids by decreasing total daily use by 1-2 tablets per day as tolerated.    -Take Tylenol as prescribed  -Use non-medication forms of pain relief (such as ice, heat, rest, repositioning, meditation, breathing, acupuncture, etc) as much as possible to minimize need for opioids.  -Do NOT take any non-steroidal anti-inflammatory medications (Advil, Aleve, Motrin, Ibuprofen) for 12 weeks after injury or surgery. These medicines slow bone growth and healing.  -If you have questions or concerns about your opioid prescription our orthopedic pharmacy can be reached at [phone], M-F 8am-4pm. They can evaluate your pain needs and are able to prescribe more if deemed necessary. Please allow 48 hours for pain medication refills.  -Do not use alcohol or illicit drugs while taking opioids.  -Do not use hypnotic sleep medications while using opioids.     Wound care and general info:  -Keep incision clean and dry.  -Change dressings with dry gauze and tape daily. Once incision is no longer draining x48 hours it is ok to leave open to air.  -For showers cover with occlusive dressing until incision is completely dry. Avoid direct water pressure over incision, but ok for warm water and mild soap to run over incision. Do not scrub or rub. Pat dry only.  -No soaking incision  -If sutures are present they will be removed in  Spine Clinic around 3-4 weeks postoperatively  -No driving while taking narcotics or while in a brace  -Do not smoke or use tobacco or nicotine products because it impairs bone healing     Reasons to call include:  -Weakness, loss of movement, numbness or pain in your neck, arms, back, legs or feet.  -Change in bowel or bladder function  -Fever of more than 100 F or 38 C  -Increased pain around the incision  -Increased or different colored drainage  -Increased redness or swelling around a wound or incision  -Foul smelling drainage  -The edges of your wound have come apart  -Severe headache or extreme tiredness    Orthopedic Pharmacy: [phone]  Call for refills or medication questions.  Please allow 48 hours for pain medication refills.  Pharmacists work hours are from Monday to Friday   (off weekends and major holidays).    To contact the Spine Clinic Nurse: [phone] (Mon-Fri 8:00 a.m. - 4:00 p.m.)  CLINIC NURSE LINE: call with your medical questions    Community Care Line: (24-hour nurse line) after clinic hours, [phone]  weekends and holidays.

## 2020-01-04 NOTE — Discharge Instr - Appointments (Signed)
The appointment on 01/28/20 with [facility] is an incision check, x-ray follow up. Please mention work hardening at this visit

## 2020-01-04 NOTE — Progress Notes (Signed)
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    DOS: 01/03/2020: C6-C7 discectomy and C6-C7 ACDF ([place])     SUBJECTIVE       INTERVAL HISTORY:  Noted some swallowing issues this morning. Anxiety/panic attack during my morning interview. Answered several questions in regards to surgery/discharge this morning.    SCHEDULED MEDICATIONS:   .  acetaminophen, 650 mg, q6h SCH  .  polyethylene glycol 3350, 17 g, Daily  .  senna, 17.2 mg, BID    INFUSED MEDICATIONS:  ketamine, 15 mg/hr, Continuous, Last Rate: Stopped (01/03/20 1142)  .  Plasma-Lyte A, 10 mL/hr, Continuous, Last Rate: Stopped (01/03/20 1142)  .  sodium chloride, 100 mL/hr, Continuous, Last Rate: 100 mL/hr (01/03/20 1639)     PRN MEDICATIONS:  ALPRAZolam, 0.5 mg, TID PRN  .  cyclobenzaprine, 5 mg, q8h PRN  .  HYDROmorphone, 0.2-0.4 mg, q4h PRN  .  melatonin, 3 mg, q HS PRN  .  oxyCODONE, 5-15 mg, q3h PRN       OBJECTIVE     Vitals (Most recent in last 24 hrs)     T: 36.5 C (01/04/20 0443)  BP: 137/79 (01/04/20 0443)  HR: 91 (01/04/20 0443)  RR: 16 (01/04/20 0443)  SpO2: 97 % (01/04/20 0443)  T range: Temp  Min: 36.5 C  Max: 37.1 C  Admit weight: (not recorded)  Last weight: (not recorded)         I&Os:     Intake/Output Summary (Last 24 hours) at 01/04/2020 0645  Last data filed at 01/04/2020 0406  Intake 2051.02 ml   Output 1875 ml   Net 176.02 ml       Physical Exam  General: Resting in bed, NAD?  Pulm: Breathing comfortably, no work of breathing    Neuro: Responds appropriately to questions?      Upper Extremity:           L R      C5 [elbow flexion]         5 5  C6 [wrist extension]      5 5  C7 [elbow extension]    5 5  C8 [finger flexion]          5 5  T1 [interossei]               5 5    Sensation to  light touch intact in C5-C7 distribution. Pain/dysesthesias L C6 distribution  2+ radial pulses.         Labs (last 24 hours):   Chemistries  CBC  LFT  Gases, other   - - - -   -   AST: - ALT: -  -/-/-/-  -/-/-/-   - - -   - >< -  AP: - T bili: -  Lact (a): - Lact (v): -   eGFR: - Ca: -   -   Prot: - Alb: -  Trop I: - D-dimer: -   Mg: - PO4: -  ANC: -     BNP: - Anti-Xa: -     ALC: -    INR: -        MRI reviewed demonstrating complete removal of large herniated  disc.     ASSESSMENT/PLAN       - PT/OT when tolerated  - Spine Precautions: None - Treated  - Brace: [place] J for comfort, not required  - HOB restrictions:  None  - MAP goals:  None  - Radiology: [ reviewed ] MRI STAT [ ]  UR XRs  - Activity:  Up ad lib. No bending, twisting, lifting >10lbs x 3 months post op  - Wound Care: POD5  - Nutrition: General diet  - Antibiotics: 24hrs ancef completed  - Anticoagulation:  SCDs  - Pain plan: IV and PO narcotics prn; please hold NSAIDs x 12 weeks post op  - TLD:    JP - x1 to be removed today  - FU in Ortho Spine clinic 3 weeks post op for repeat upright xrays, wound check, suture removal   - Dispo: Plan for discharge later today after UR radiographs, anxiety/pain control (TID Alprazolam 0.57m added this morning)

## 2020-01-06 ENCOUNTER — Other Ambulatory Visit (HOSPITAL_BASED_OUTPATIENT_CLINIC_OR_DEPARTMENT_OTHER): Payer: Self-pay

## 2020-01-06 ENCOUNTER — Encounter (HOSPITAL_BASED_OUTPATIENT_CLINIC_OR_DEPARTMENT_OTHER): Payer: Self-pay | Admitting: Case Manager/Care Coordinator

## 2020-01-12 NOTE — Op Note (Signed)
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    PROCEDURES:  1.  C6-7 anterior cervical discectomy and interbody arthrodesis (88916).  2.  C6-C7 application of Synthes ACF lordotic interbody allograft spacer (94503).  3.  C6-C7 anterior plating using a Synthes cervical spine locking plate (88828).    ANESTHESIA:  General endotracheal.    DISPOSITION:  Patient was transferred out of the operating room in stable condition.    INDICATIONS:  Cody Hart is a 39 year old male who was sent for a surgical consultation regarding relatively new onset of left upper extremity sensorimotor deficits with myelopathic signs secondary to a large C6-C7 disc herniation that was extruded cranially behind the C6 vertebral body, extending almost all the way up to the C5-C6 disc space.  The patient's symptoms were not improving over time, and I felt that the patient would benefit from a surgical intervention, particularly given his neurological deficits.  I explained to the patient that, given the challenging location and the size of his disc herniation, he may require a C6 corpectomy, but I felt that it was likely that we would be able to tease out the herniated disc fragment from behind the C6 vertebral body, and perform only C6-C7 anterior cervical discectomy and interbody arthrodesis.  I also presented him with the possibility that we would not be able to ascertain intraoperatively whether sufficient decompression had been achieved, but, absent the certainty that we absolutely needed to do a C6 corpectomy, we would likely proceed with C6-C7 anterior cervical discectomy and interbody arthrodesis, and obtain postoperative imaging, which  may lead to the need for an early reoperation if we were to discover an insufficient decompression.      We discussed the rationale for surgery versus continued nonoperative care.  Risks discussed include the possibility of a neurological deficit that could range in severity from a temporary and isolated nerve root deficit to complete and permanent spinal cord injury with the loss of the ability to move and feel lower extremities and upper extremities, and the loss of bowel and bladder function, all of which could be permanent.  Also discussed were the possibility of CSF leak, infection, hardware malposition or failure, pseudoarthrosis, potential need for reoperation at the same level or other levels, potential for unsatisfactory outcome in general, as well as the possibility of significant medical comorbidities including a life-threatening thromboembolic event such as stroke, myocardial infarction, pulmonary embolism, among others.  The patient accepted and acknowledged these risks, and wished to proceed.  Written consent was obtained.    DESCRIPTION OF PROCEDURE:  The patient was brought to the operating room.  General anesthesia was administered.  Intravenous antibiotics were administered for surgical prophylaxis.  Baseline electrodiagnostic studies were obtained.  The patient was positioned supine on a Jackson table.  Head halter traction of 10 pounds was applied.  He was placed in slight reverse Trendelenburg position, and slight traction was applied to the shoulders.  The anterior cervical spine was prepped and draped in the usual sterile fashion.    A standard timeout procedure was performed, proceeding only once all parties were in agreement. A left-sided transverse incision was centered over C6-C7, and was extended from the midline out to the  medial border of the sternocleidomastoid.  The incision was taken down through the platysma, and platysmal flaps were raised.  The various layers of the deep cervical  fascia were then dissected along the anterior border of the sternocleidomastoid, and then between the carotid sheath laterally and esophagus medially, down to the level of the prevertebral fascia.      Once the appropriate level was confirmed radiographically and we had released the omohyoid to allow for appropriate exposure, the prevertebral fascia was then incised, and the longus colli muscles were dissected from the midline out laterally, from the middle of the C6 to the middle of the C7 vertebra.  Self-retaining retractors were placed deep to the longus colli muscles.  The appropriate level was once again confirmed.    We then applied Caspar pins in a divergent manner to the C6 and C7 vertebrae.  A #15 blade knife was used to incise the anterior annulus of C6-C7.  Slight distraction was achieved across the Caspar pins, and the operative microscope was brought onto the field.  A thorough discectomy was performed under microscopic visualization, back to the level of the posterior annulus.  A large rent was noted in at the level of the posterior annulus, slightly to the left of midline.  We then resected the posterior annulus from uncovertebral joint to uncovertebral joint.  We then proceeded, with the use of a micro nerve hook, to fish out several large herniated disc fragments from the posterior aspect of the disc space, and then from behind the C6 vertebra.  This was done with the use of both micro nerve hooks as well as regular nerve hooks, as well as 90-degree angled curettes.  Once we had removed several extremely large fragments, I could no longer palpate any compressive lesions behind the C6 or the C7 vertebral bodies.  The posterior longitudinal ligament was resected from uncovertebral joint to uncovertebral joint in order to visualize the dura as well as the exiting nerve roots bilaterally.  I spent a considerable amount of time trying to find any additional potential herniated fragments from behind the  vertebral bodies, and was unable to do so.      At this point, I felt quite confident that we had obtained the necessary decompression due to the number and size of the disc fragments that we had removed.  I did not feel a corpectomy would be warranted.  I felt it would be in the patient's best interest to complete the anterior cervical discectomy and interbody arthrodesis, and then obtain postoperative imaging to ensure that there was no residual large compressive fragment behind the C6 vertebra.    Under lateral fluoroscopic guidance, we then used the trials for the Synthes ACF lordotic interbody allograft spacer, and selected the definitive spacer corresponding to the first trial that gave a solid press-fit.  The definitive Synthes ACF interbody allograft spacer was tapped into position and slightly countersunk relative to the anterior cortices of the vertebral bodies of C6 and C7.  Caspar pin traction was removed, as were the Caspar pins themselves, and head halter traction was removed as well.  Radiographs confirmed acceptable positioning of the interbody spacer and alignment of the cervical spine.    We then proceeded with the anterior plating.  A cervical spine locking plate of appropriate height was contoured into lordosis to correspond to the anterior aspect of the cervical spine, and then secured with 2 screws each into the C6 and C7 vertebra.  Excellent purchase was achieved  with all 4 screws.  Locking screws were placed.  Orthogonal radiographs confirmed acceptable positioning of hardware and alignment of the cervical spine.    The wound was thoroughly irrigated.  Hemostasis was secured.  As a matter of routine, I inspected the soft tissues of the neck and could identify no areas of concern.  Layered closure was performed over a drain.  Sterile nonadherent dressings were applied.  The patient was then awakened, extubated, and transferred out of the operating room in stable condition.    Sponge and needle  counts were correct at the completion of the procedure.  There were no immediate complications noted.    [place] [facility] [place] [facility] REQUIRED COMPLIANCE STATEMENTS:  I, Dorothea Ogle, MD, was present for the key elements of this procedure and immediately available for the entire procedure.    Dr. Helayne Seminole assistance was required due to the lack of qualified available resident assistance.    Dr. Leanord Asal was immediately available should any additional assistance have been required.

## 2020-01-13 ENCOUNTER — Telehealth (HOSPITAL_BASED_OUTPATIENT_CLINIC_OR_DEPARTMENT_OTHER): Payer: Self-pay | Admitting: Orthopaedic Surgery of the Spine

## 2020-01-13 NOTE — Telephone Encounter (Signed)
RETURN CALL: Voicemail - Detailed Message      SUBJECT:  Refill Request    NAME OF MEDICATION(S):   acetaminophen 325 MG tablet    oxyCODONE 5 MG tablet  DATE NEEDED BY: ASAP  PRESCRIBING PROVIDER: [place]  PHARMACY NAME/LOCATION:   [facility] / [address] [place]  [address]  ADDITIONAL INFORMATION:     Will be out in 2 days. High priority

## 2020-01-14 ENCOUNTER — Telehealth (HOSPITAL_BASED_OUTPATIENT_CLINIC_OR_DEPARTMENT_OTHER): Payer: Self-pay | Admitting: Pharmacist

## 2020-01-14 ENCOUNTER — Other Ambulatory Visit (HOSPITAL_BASED_OUTPATIENT_CLINIC_OR_DEPARTMENT_OTHER): Payer: Self-pay

## 2020-01-14 DIAGNOSIS — Z4789 Encounter for other orthopedic aftercare: Secondary | ICD-10-CM

## 2020-01-14 MED ORDER — NARCAN 4 MG/0.1ML NA LIQD
NASAL | 0 refills | Status: AC
Start: 2020-01-14 — End: ?

## 2020-01-14 MED ORDER — ACETAMINOPHEN 325 MG OR TABS
325.0000 mg | ORAL_TABLET | Freq: Four times a day (QID) | ORAL | 0 refills | Status: AC | PRN
Start: 2020-01-14 — End: ?

## 2020-01-14 MED ORDER — OXYCODONE HCL 5 MG OR TABS
5.0000 mg | ORAL_TABLET | ORAL | 0 refills | Status: AC | PRN
Start: 2020-01-14 — End: ?

## 2020-01-14 NOTE — Telephone Encounter (Signed)
ORTHOPEDIC PHARMACY NOTE       Patient Phone: [phone]   Pharmacy Phone: [facility] [place]    Referring Physician: Estrella Deeds    Last Appt: discharged 01/04/20    Next Appt: 5/14    Six week post injury/surgery date: 02/14/20    Diagnosis: 01/03/20  C6-7 anterior cervical discectomy and interbody arthrodesis.  C6-C7 application of Synthes ACF lordotic interbody allograft spacer.  C6-C7 anterior plating using a Synthes cervical spine locking plate.     Current Medication Requested/Daily Usage: 4/20 oxycodone 5 mg #42 1-2 q4 hours prn pain, Tylenol 325 mg #60 2 q6 hours    Other relevant Medication/Information:     Allergies: Review of patient's allergies indicates:  Allergies   Allergen Reactions    Shellfish-Derived Products GI: Pain/Diarrhea     Assessment/Plan: Received inbasket message that Cody Hart requested oxycodone and Tylenol.  On discussion with Cody Hart he reports that he has 4 tablets of each left.  He has been taking Tylenol 2 q6 hours regularly.  He is taking oxycodone 3-4 tablets per day.  He has continued to take methocarbamol every 6 hours for spasms.  He took alprazolam but ran out a couple days ago.  He did not pick up gabapentin on discharge as he had a syncopal event from it before surgery.  Discussed adverse effects of opioids of sedation and respiratory depression.  Discussed naloxone and described when and how it would be used in an emergency situation.  He may not be able to pick up naloxone due to cost but will send Rx and he understands its use.  Discussed methocarbamol for muscle spasms and that he may begin to taper it if not having spasms.  Oxycodone 5 mg #30 1 q4 hours prn pain.  Tylenol 325 mg #100 1-2 q6 hours prn pain.  Naloxone nasal spray 4 mg/0.1 ml #2 use one spray in one nostril for suspected opioid overdose.  Call 911.  If unresponsive in 2-3 minutes repeat with new naloxone nasal spray.    Naloxone prescribed:  Today 01/14/20  Opioid education materials:  01/04/20 discharge  AVS

## 2020-01-18 ENCOUNTER — Other Ambulatory Visit (HOSPITAL_BASED_OUTPATIENT_CLINIC_OR_DEPARTMENT_OTHER): Payer: Self-pay | Admitting: Physician Assistant

## 2020-01-18 DIAGNOSIS — Z981 Arthrodesis status: Secondary | ICD-10-CM

## 2020-01-28 ENCOUNTER — Encounter (HOSPITAL_BASED_OUTPATIENT_CLINIC_OR_DEPARTMENT_OTHER): Payer: Self-pay

## 2020-01-28 ENCOUNTER — Ambulatory Visit: Payer: No Typology Code available for payment source | Admitting: Case Manager/Care Coordinator

## 2020-01-28 ENCOUNTER — Ambulatory Visit (HOSPITAL_BASED_OUTPATIENT_CLINIC_OR_DEPARTMENT_OTHER): Payer: No Typology Code available for payment source | Admitting: Unknown Physician Specialty

## 2020-01-28 ENCOUNTER — Ambulatory Visit
Admission: RE | Admit: 2020-01-28 | Discharge: 2020-01-28 | Disposition: A | Discharge status: Home / Self Care | Payer: No Typology Code available for payment source | Source: Ambulatory Visit | Attending: Diagnostic Radiology | Admitting: Diagnostic Radiology

## 2020-01-28 VITALS — BP 132/84 | HR 93 | Temp 99.8°F | Ht 75.0 in | Wt 179.0 lb

## 2020-01-28 DIAGNOSIS — M5020 Other cervical disc displacement, unspecified cervical region: Secondary | ICD-10-CM

## 2020-01-28 DIAGNOSIS — S29012A Strain of muscle and tendon of back wall of thorax, initial encounter: Secondary | ICD-10-CM

## 2020-01-28 DIAGNOSIS — S161XXA Strain of muscle, fascia and tendon at neck level, initial encounter: Secondary | ICD-10-CM

## 2020-01-28 DIAGNOSIS — Z981 Arthrodesis status: Secondary | ICD-10-CM

## 2020-01-28 DIAGNOSIS — M5412 Radiculopathy, cervical region: Secondary | ICD-10-CM

## 2020-01-28 DIAGNOSIS — Z026 Encounter for examination for insurance purposes: Secondary | ICD-10-CM

## 2020-01-28 NOTE — Progress Notes (Signed)
Dictation #1  ULA:[id number]  HOZ:[id number]  [id number]

## 2020-01-30 NOTE — Progress Notes (Signed)
I did not see the patient, but have reviewed the findings and agree with the plan as above.

## 2020-01-31 NOTE — Progress Notes (Signed)
ORTHOPEDIC CLINIC NOTE    ATTENDING OF RECORD:  Dr. Scherry Ran    DATE OF SURGERY:  12/24/2019:  C6-7 discectomy and ACDF.    ATTENDING SURGEON:  Dr. Harrold Donath    L&I CLAIM NUMBER:  [id number]      INTERVAL HISTORY:  A 39 year old male who presents for a followup after the above-listed procedure.  He reports that since surgery, with recovery at home, he has been doing okay.  His swelling has improved.  He still has some mild radiculopathy, primarily in his left C5-C6 distributions and right C7-C8 distributions.  He has been wearing his C-collar intermittently.  Denies any new numbness or tingling.  No new weakness of note, and no gait imbalance.    He is currently taking minimal pain medication.  He describes a sinus pressure that has been present before surgery, for which he is currently trying to get back to see PCP to discuss.      PAST MEDICAL HISTORY/SURGICAL HISTORY, MEDICATIONS, ALLERGIES:    Reviewed, and no change from prior.    PHYSICAL EXAMINATION:  GENERAL:  In no acute distress, sitting comfortably.  CARDIOVASCULAR:  Extremities warm and well perfused.  PULMONARY:  Comfortable on room air.  No excess work of breathing.  VITAL SIGNS:  Blood pressure is 132/84.  Temperature is 37.7 and pulse is 93, satting 95% on room air.    MUSCULOSKELETAL:  Right upper extremity:  C5-T1 strength 5/5.  Sensation diminished in C7-C8 to light touch.  Left upper extremity:  C5-T1 strength 5/5.  Sensation diminished in the left C5-C6 distribution.  Negative Hoffmann's.    IMAGING:  X-ray of his C-spine was reviewed and demonstrates no acute changes.  Hardware is in place with his ACDF bone graft at the C6-C7 level.  Normal alignment compared to postoperative films in the hospital.    ASSESSMENT AND PLAN:  A 39 year old male who presents for a 3-week followup status post a C6-7 discectomy and ACDF on 12/24/2019.  Patient is reportedly doing well.  His swelling symptoms have improved, and he is requiring  minimal pain medications for pain control  at this time.  He is intermittently using his collar, which I said is okay as long as, if he is going outside and doing any trail walking, we advised him to wear the C-collar for protection.    His imaging was reviewed and demonstrates no change in alignment, and hardware still in place.  His incision is also healing appropriately, without erythema.    His activity prescription form was filled out today.  Plan to see patient back in 9 weeks at the 39-month followup with repeat x-rays of his C-spine at that time.  Reviewed with patient postop restrictions including no heavy lifting, more than 10 pounds; no excessive twisting or bending.    This patient was discussed with Dr. Jamesetta Orleans.

## 2020-02-08 ENCOUNTER — Telehealth (HOSPITAL_BASED_OUTPATIENT_CLINIC_OR_DEPARTMENT_OTHER): Payer: Self-pay | Admitting: Orthopaedic Surgery of the Spine

## 2020-02-08 NOTE — Telephone Encounter (Signed)
RETURN CALL: Voicemail - Detailed Message      SUBJECT:  Orders     ORDERS FOR: CT/ Xrays of Cspine   VERBAL OR FAXED ORDER REQUEST: faxed to [facility] in [place]   ADDITIONAL INFORMATION: Patient stated that he was supposed to have CT orders sent to [facility]. [facility] not able to verify CT orders. Please contact patient and clarify type of imaging he needs to have completed and send orders to [place], production

## 2020-02-08 NOTE — Progress Notes (Signed)
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 provider found  PCP: Junita Push, PA-C  Attending Provider: Golden Pop, MD  Accepted Diagnoses: U38.1XXA STRAIN MUSCLE FASC & TENDON NECK LEVL INIT ENC, S21.012A STRAIN MUSCLE & TENDON BACK WALL THORAX INIT ENC, M54.12 RADICULOPATHY CERVICAL REGION, M50.20 OTH CERVICAL DISC DISPLACEMENT UNS CERV REGION    Language: English    Assessment of Return to Work Barriers:    Category: Health Service  Current Work Status: NO  Reported by: Worker  Barriers to Return to Work: Medical Condition    [place] Completed Actions:    Prompted by: Worker  Who was Contacted: Worker  Worker: Met with the patient in our clinic 01/28/2020.  Provider: Work limitations and recovery progress was discussed with Dr. Gale Journey and Dr. Rosiland Oz Submitted on behalf of Healthcare Provider:  [place]   Education: [place]  Communication: [facility] met with pt in clinic on 01/28/2020. Travel paperwork was provided and an APF was completed by the provider.     Return to Work Plan:     Patient: Follow-up appointment on 03/30/2020    [place]: Review case again on 03/30/2020  Provider: Ellen Henri    Billing Code:     Catastrophic Codes, Face-to-Face: Health Service, 6 minutes

## 2020-02-10 NOTE — Telephone Encounter (Signed)
I reviewed the last spine clinic notes and there was no mention of a CT scan prior to the patient's 12wk post-op appointment.  There were  no TE documented.  I also called the patient if he has any post-op op recovery concerns or new symptoms and he has none.  I have closed the CT C-spine referral and we will see  the patient at his scheduled 12 wks. post-op appointment with Dr. Estrella Deeds.

## 2020-03-28 ENCOUNTER — Other Ambulatory Visit (HOSPITAL_BASED_OUTPATIENT_CLINIC_OR_DEPARTMENT_OTHER): Payer: Self-pay | Admitting: Physician Assistant

## 2020-03-28 DIAGNOSIS — M4322 Fusion of spine, cervical region: Secondary | ICD-10-CM

## 2020-03-30 ENCOUNTER — Ambulatory Visit (HOSPITAL_BASED_OUTPATIENT_CLINIC_OR_DEPARTMENT_OTHER): Payer: No Typology Code available for payment source | Admitting: Physician Assistant

## 2020-03-30 ENCOUNTER — Encounter (HOSPITAL_BASED_OUTPATIENT_CLINIC_OR_DEPARTMENT_OTHER): Payer: Self-pay | Admitting: Orthopaedic Surgery of the Spine

## 2020-03-30 ENCOUNTER — Ambulatory Visit (HOSPITAL_BASED_OUTPATIENT_CLINIC_OR_DEPARTMENT_OTHER): Payer: No Typology Code available for payment source | Admitting: Case Manager/Care Coordinator

## 2020-03-30 ENCOUNTER — Ambulatory Visit
Admission: RE | Admit: 2020-03-30 | Discharge: 2020-03-30 | Disposition: A | Discharge status: Home / Self Care | Payer: No Typology Code available for payment source | Attending: Diagnostic Radiology | Admitting: Diagnostic Radiology

## 2020-03-30 VITALS — BP 147/88 | HR 86 | Temp 99.4°F | Ht 75.0 in | Wt 179.0 lb

## 2020-03-30 DIAGNOSIS — S161XXA Strain of muscle, fascia and tendon at neck level, initial encounter: Secondary | ICD-10-CM

## 2020-03-30 DIAGNOSIS — Z981 Arthrodesis status: Secondary | ICD-10-CM | POA: Insufficient documentation

## 2020-03-30 DIAGNOSIS — Z026 Encounter for examination for insurance purposes: Secondary | ICD-10-CM | POA: Insufficient documentation

## 2020-03-30 DIAGNOSIS — X58XXXA Exposure to other specified factors, initial encounter: Secondary | ICD-10-CM | POA: Insufficient documentation

## 2020-03-30 DIAGNOSIS — M5412 Radiculopathy, cervical region: Secondary | ICD-10-CM | POA: Insufficient documentation

## 2020-03-30 DIAGNOSIS — S29012A Strain of muscle and tendon of back wall of thorax, initial encounter: Secondary | ICD-10-CM | POA: Insufficient documentation

## 2020-03-30 DIAGNOSIS — M4322 Fusion of spine, cervical region: Secondary | ICD-10-CM

## 2020-03-30 DIAGNOSIS — M5020 Other cervical disc displacement, unspecified cervical region: Secondary | ICD-10-CM

## 2020-03-30 NOTE — Progress Notes (Signed)
Workers Comp   Date of Injury: 06/14/2019  Claim # [id number]  Please CC clinic note to:  [employer] of [employer]  [address] [address]  [place], [place] [address]    Dictation #1  QAS:[id number]  LNL:[id number]

## 2020-04-01 NOTE — Progress Notes (Signed)
ATTENDING PHYSICIAN:  Harrold Donath, MD.    PATIENT IDENTIFICATION AND CHIEF COMPLAINT:  Cody Hart is a pleasant 39 year old gentleman who underwent a C6-7 diskectomy and ACDF on 12/24/2019.  He is here for regularly scheduled clinical followup.  He was discharged with his [place] J collar during his last visit, he has been spending most all of his time without the collar.  He has also significantly reduced his medications.  He takes 1 tablet of oxycodone per week on days that are particularly stressful for him.  He will continue to have neck pain depending on his position and activity, sitting in one position with his head unsupported for long periods of time will cause some pain in his neck as well as extreme rotation and flexion.  He stated that occasionally he will get a sensation of incomplete swallowing and occasionally he will have some dysphonia, but these are inconsistent and issues that overall, continue to resolve or diminish at the very least.  He is starting to develop some mid thoracic axial pain, again, related to activity.  He has had no traumas in the interim since the last visit with [place], he continues to have improvement in his upper extremity radiculopathy.  He feels as though his sensation is back to normal level in his hands which he is quite happy about.  He has not started physical therapy.  He denies any issues with gait or balance.    PHYSICAL EXAMINATION:  GENERAL:  Cody Hart is a pleasant 39 year old gentleman in no acute distress, alert and oriented x4.  VITAL SIGNS:  Include a height of 190.5 cm, a weight of 81 kg, BMI of 22, his blood pressure is 147/88, temperature is 37.4, pulse 86, SpO2 of 95%.  MUSCULOSKELETAL:  On manual muscle testing, the patient has 5/5 strength bilaterally with shoulder abduction, elbow extension and flexion, wrist extension and flexion, finger abduction and grip strength.  He sits and stands with a slightly kyphotic posture.  He has appropriate range of  motion of the cervical spine with rotation and extension.  He is a bit limited with flexion.  He has no tenderness to palpation of spinous process, paraspinous musculature.  NEUROVASCULAR:  The patient endorses light touch sensation in all distributions of his cervical spine.  He is missing the distal two-thirds of his left middle finger.  He has altered sensation at the tip of that longstanding.    IMAGING:  AP, lateral, flexion/extension x-rays were taken of his cervical spine and compared to a similar series taken back in May and pre intraoperatively.  He has developed a cervical kyphosis, which is not completely resolved on extension.  On his preop scans he does have straightening of the cervical lordosis, but is not kyphotic at baseline and does present as a lordotic curve on extension films.  His graft does not appear to be fully incorporated between C6 and C7.  However, the screw and plate construct remains stable.  No evidence of lucency around the screws or movement on flexion and extension views.  He has retrolisthesis of C5 on C6 that is about 4 mm on upright films.    ASSESSMENT AND PLAN:  Cody Hart is a pleasant 39 year old gentleman now 3 months status post C6-C7 ACDF happy to hear that his radicular symptoms have significantly improved.  He does have some dysphagia, dysphonia after this procedure, but it seems to be resolving.  I think will continue to do so.  We discussed a referral for speech  therapy, but the patient would like to give it a bit more time to see if it resolves fully as it is trending to do.  His posture I believe is related to muscle guarding I think some physical therapy with focus on range of motion and pain modalities will be helpful in this regard.  I have given him a script to start PT with a special emphasis on postural control, range of motion, pain modalities.  We will continue to hold him off from full work duty as his job requires significant lifting overhead being static  in awkward positions such as bending, stooping with rotations of the neck.  We will reevaluate this after completion of physical therapy.  Anticipate at that time to release him to full duty.  We will see him again in 3 months' time.  Activity prescription form and physical therapy referral were generated for the patient upon his departure.  We will schedule him again in 3 months' time.

## 2020-04-03 NOTE — Progress Notes (Signed)
Department of Labor and [place] Number: [id number]    General Information:    Insurance: State Workers' Comp  Claim Form Submitted: YES  Claim Status: Open/Allowed  Date of Injury: 06/14/2019  Date of First Visit: 11/18/2019  Employer Name/Phone: [employer]  Job of Injury: Environmental manager  Referring Provider: No ref. provider found  PCP: Junita Push, PA-C  Attending Provider: Runell Gess, PA-C  Accepted Diagnoses: S16.1XXA STRAIN MUSCLE FASC & TENDON NECK LEVL INIT ENC, S26.012A STRAIN MUSCLE & TENDON BACK WALL THORAX INIT ENC, M54.12 RADICULOPATHY CERVICAL REGION, M50.20 OTH CERVICAL DISC DISPLACEMENT UNS CERV REGION    Language: English    Assessment of Return to Work Barriers:    Category: Health Service  Current Work Status: NO  Reported by: Worker  Barriers to Return to Work: Medical Condition    [place] Completed Actions:    Prompted by: Worker  Who was Contacted: Worker  Worker: Met with the patient in our clinic on 03/30/2020  Provider: Work limitations and recovery progress was discussed with Kendra Opitz, PA-C  Paperwork Submitted on behalf of Healthcare Provider:  ACTIVITY PRESCRIPTION FORM   Education: [employer] Information  Communication: [facility] met with patient and his wife in clinic. [place] provided the patient with a travel reimbursement form. Patient did not have any specific questions or needs at this time.     Return to Work Plan:     Patient: Follow-up appointment on 07/06/2020    [place]: Review case again on 07/06/2020  Provider: Submit Paperwork    Billing Code:     Catastrophic Codes, Face-to-Face: Health Service, 6 minutes

## 2020-06-01 ENCOUNTER — Other Ambulatory Visit (HOSPITAL_BASED_OUTPATIENT_CLINIC_OR_DEPARTMENT_OTHER): Payer: Self-pay

## 2020-07-04 ENCOUNTER — Other Ambulatory Visit (HOSPITAL_BASED_OUTPATIENT_CLINIC_OR_DEPARTMENT_OTHER): Payer: Self-pay | Admitting: Physician Assistant

## 2020-07-04 DIAGNOSIS — M4322 Fusion of spine, cervical region: Secondary | ICD-10-CM

## 2020-07-06 ENCOUNTER — Telehealth (HOSPITAL_BASED_OUTPATIENT_CLINIC_OR_DEPARTMENT_OTHER): Payer: No Typology Code available for payment source | Admitting: Orthopaedic Surgery of the Spine

## 2020-07-06 ENCOUNTER — Encounter (HOSPITAL_BASED_OUTPATIENT_CLINIC_OR_DEPARTMENT_OTHER): Payer: Self-pay | Admitting: Orthopaedic Surgery of the Spine

## 2020-07-06 ENCOUNTER — Ambulatory Visit (HOSPITAL_BASED_OUTPATIENT_CLINIC_OR_DEPARTMENT_OTHER): Payer: No Typology Code available for payment source | Admitting: Orthopaedic Surgery of the Spine

## 2020-07-06 ENCOUNTER — Ambulatory Visit
Admission: RE | Admit: 2020-07-06 | Discharge: 2020-07-06 | Disposition: A | Discharge status: Home / Self Care | Payer: No Typology Code available for payment source | Attending: Diagnostic Radiology | Admitting: Diagnostic Radiology

## 2020-07-06 VITALS — BP 136/87 | HR 100 | Temp 98.6°F | Ht 75.0 in | Wt 200.0 lb

## 2020-07-06 DIAGNOSIS — M48062 Spinal stenosis, lumbar region with neurogenic claudication: Secondary | ICD-10-CM

## 2020-07-06 DIAGNOSIS — M4322 Fusion of spine, cervical region: Secondary | ICD-10-CM | POA: Insufficient documentation

## 2020-07-06 DIAGNOSIS — M5416 Radiculopathy, lumbar region: Secondary | ICD-10-CM

## 2020-07-06 DIAGNOSIS — M5412 Radiculopathy, cervical region: Secondary | ICD-10-CM

## 2020-07-06 NOTE — Progress Notes (Signed)
ATTENDING PHYSICIAN:  Harrold Donath, MD.    PATIENT IDENTIFICATION AND CHIEF COMPLAINT:  [name] is a pleasant 39 year old gentleman who underwent a C6-7 diskectomy and ACDF on 12/24/2019.  He is here for regularly scheduled clinical followup at 6 months. Doing well. His dysphagia and dystonic is resolving. Mainly here and want to start PT. He is getting better and stronger. Mostly doing PT on his own. Progressing as tolerated. Has not return to work     PHYSICAL EXAMINATION:  GENERAL:  [name] is a pleasant 39 year old gentleman in no acute distress, alert and oriented x4.  VITAL SIGNS:   his blood pressure is 136/87  temperature is 37.4, pulse 100  MUSCULOSKELETAL:  On manual muscle testing, the patient has 5/5 strength bilaterally with shoulder abduction, elbow extension and flexion, wrist extension and flexion, finger abduction and grip strength.  He sits and stands with a slightly kyphotic posture.  He has appropriate range of motion of the cervical spine with rotation and extension.  He is a bit limited with flexion.  He has no tenderness to palpation of spinous process, paraspinous musculature.  NEUROVASCULAR:  The patient endorses light touch sensation in all distributions of his cervical spine.  He is missing the distal two-thirds of his left middle finger.  He has altered sensation at the tip of that longstanding.    IMAGING:  AP, lateral, flexion/extension x-rays were taken of his cervical spine and compared to a similar series taken back.   His graft does appear to be fully incorporated between C6 and C7.   the screw and plate construct remains stable.  No evidence of lucency around the screws or movement on flexion and extension views.     ASSESSMENT AND PLAN:  [name] is a pleasant 39 year old gentleman now 6 months status post C6-C7 ACDF happy to hear that his radicular symptoms have significantly improved.  He does have some dysphagia, dysphonia after this procedure, but it seems to be  resolving.  I think will continue to do so.  We gave him a prescription of PT and to advance his activity as tolerated. Doing well. His dysphagia is resolving doing well. We gave him an activity prescription to advance as tolerated after his PT prescription. As tolerated.  RTC in 6 months for repeat exam and c spine xrays or sooner if indicated

## 2020-07-26 ENCOUNTER — Other Ambulatory Visit (HOSPITAL_BASED_OUTPATIENT_CLINIC_OR_DEPARTMENT_OTHER): Payer: Self-pay

## 2020-09-11 NOTE — Telephone Encounter (Signed)
APF was completed by [facility], MARK S on 07/06/20. Documents have been faxed to [facility] and scanned into Epic.

## 2023-03-11 ENCOUNTER — Emergency Department: Payer: Self-pay

## 2023-03-13 ENCOUNTER — Emergency Department: Payer: Self-pay

## 2023-03-17 ENCOUNTER — Emergency Department: Payer: Self-pay

## 2023-03-18 ENCOUNTER — Inpatient Hospital Stay: Payer: Self-pay
